# Patient Record
Sex: Female | Born: 1960 | Race: Black or African American | Hispanic: No | Marital: Single | Smoking: Current every day smoker
Health system: Southern US, Community
[De-identification: ages and names within clinical notes are randomized; demographics above are authoritative.]

## PROBLEM LIST (undated history)

## (undated) DIAGNOSIS — M5430 Sciatica, unspecified side: Secondary | ICD-10-CM

## (undated) DIAGNOSIS — N390 Urinary tract infection, site not specified: Secondary | ICD-10-CM

## (undated) DIAGNOSIS — I10 Essential (primary) hypertension: Secondary | ICD-10-CM

## (undated) DIAGNOSIS — N200 Calculus of kidney: Secondary | ICD-10-CM

## (undated) HISTORY — PX: ABDOMINAL HYSTERECTOMY: SHX81

---

## 2000-03-03 ENCOUNTER — Encounter: Admission: RE | Admit: 2000-03-03 | Discharge: 2000-03-03 | Payer: Self-pay | Admitting: Internal Medicine

## 2000-03-03 ENCOUNTER — Encounter: Payer: Self-pay | Admitting: Internal Medicine

## 2000-03-11 ENCOUNTER — Emergency Department (HOSPITAL_COMMUNITY): Admission: EM | Admit: 2000-03-11 | Discharge: 2000-03-11 | Payer: Self-pay | Admitting: Emergency Medicine

## 2001-09-28 ENCOUNTER — Other Ambulatory Visit: Admission: RE | Admit: 2001-09-28 | Discharge: 2001-09-28 | Payer: Self-pay | Admitting: Internal Medicine

## 2003-05-17 ENCOUNTER — Ambulatory Visit (HOSPITAL_COMMUNITY): Admission: RE | Admit: 2003-05-17 | Discharge: 2003-05-17 | Payer: Self-pay | Admitting: Internal Medicine

## 2004-08-09 ENCOUNTER — Emergency Department (HOSPITAL_COMMUNITY): Admission: EM | Admit: 2004-08-09 | Discharge: 2004-08-09 | Payer: Self-pay | Admitting: Family Medicine

## 2006-11-28 ENCOUNTER — Ambulatory Visit (HOSPITAL_COMMUNITY): Admission: RE | Admit: 2006-11-28 | Discharge: 2006-11-28 | Payer: Self-pay | Admitting: Internal Medicine

## 2007-11-29 ENCOUNTER — Ambulatory Visit (HOSPITAL_COMMUNITY): Admission: RE | Admit: 2007-11-29 | Discharge: 2007-11-29 | Payer: Self-pay | Admitting: Internal Medicine

## 2009-03-25 ENCOUNTER — Emergency Department (HOSPITAL_COMMUNITY): Admission: EM | Admit: 2009-03-25 | Discharge: 2009-03-25 | Payer: Self-pay | Admitting: Family Medicine

## 2009-04-01 ENCOUNTER — Emergency Department (HOSPITAL_COMMUNITY): Admission: EM | Admit: 2009-04-01 | Discharge: 2009-04-01 | Payer: Self-pay | Admitting: Emergency Medicine

## 2009-04-02 ENCOUNTER — Inpatient Hospital Stay (HOSPITAL_COMMUNITY): Admission: EM | Admit: 2009-04-02 | Discharge: 2009-04-04 | Payer: Self-pay | Admitting: Emergency Medicine

## 2009-04-04 ENCOUNTER — Encounter: Payer: Self-pay | Admitting: Internal Medicine

## 2009-07-17 ENCOUNTER — Ambulatory Visit (HOSPITAL_COMMUNITY): Admission: RE | Admit: 2009-07-17 | Discharge: 2009-07-17 | Payer: Self-pay | Admitting: Internal Medicine

## 2010-05-10 ENCOUNTER — Encounter: Payer: Self-pay | Admitting: Internal Medicine

## 2010-07-20 LAB — BASIC METABOLIC PANEL
BUN: 16 mg/dL (ref 6–23)
CO2: 28 mEq/L (ref 19–32)
Chloride: 105 mEq/L (ref 96–112)
Potassium: 3 mEq/L — ABNORMAL LOW (ref 3.5–5.1)

## 2010-07-21 LAB — URINALYSIS, ROUTINE W REFLEX MICROSCOPIC
Glucose, UA: NEGATIVE mg/dL
Nitrite: NEGATIVE
Specific Gravity, Urine: 1.027 (ref 1.005–1.030)
pH: 6 (ref 5.0–8.0)

## 2010-07-21 LAB — DIFFERENTIAL
Eosinophils Relative: 1 % (ref 0–5)
Lymphocytes Relative: 20 % (ref 12–46)
Lymphs Abs: 1.1 10*3/uL (ref 0.7–4.0)
Monocytes Absolute: 0.3 10*3/uL (ref 0.1–1.0)
Monocytes Relative: 5 % (ref 3–12)

## 2010-07-21 LAB — BASIC METABOLIC PANEL
Chloride: 106 mEq/L (ref 96–112)
Potassium: 3 mEq/L — ABNORMAL LOW (ref 3.5–5.1)

## 2010-07-21 LAB — SEDIMENTATION RATE: Sed Rate: 14 mm/hr (ref 0–22)

## 2010-07-21 LAB — CBC
HCT: 38 % (ref 36.0–46.0)
Hemoglobin: 12.9 g/dL (ref 12.0–15.0)
RBC: 4.14 MIL/uL (ref 3.87–5.11)
WBC: 5.4 10*3/uL (ref 4.0–10.5)

## 2010-12-13 ENCOUNTER — Emergency Department (HOSPITAL_COMMUNITY)
Admission: EM | Admit: 2010-12-13 | Discharge: 2010-12-13 | Discharge status: Against Medical Advice | Payer: PRIVATE HEALTH INSURANCE | Attending: Emergency Medicine | Admitting: Emergency Medicine

## 2010-12-13 DIAGNOSIS — M545 Low back pain, unspecified: Secondary | ICD-10-CM | POA: Insufficient documentation

## 2010-12-13 DIAGNOSIS — M25559 Pain in unspecified hip: Secondary | ICD-10-CM | POA: Insufficient documentation

## 2010-12-29 ENCOUNTER — Emergency Department (HOSPITAL_COMMUNITY)
Admission: EM | Admit: 2010-12-29 | Discharge: 2010-12-29 | Disposition: A | Discharge status: Home / Self Care | Payer: PRIVATE HEALTH INSURANCE | Attending: Emergency Medicine | Admitting: Emergency Medicine

## 2010-12-29 DIAGNOSIS — M5430 Sciatica, unspecified side: Secondary | ICD-10-CM | POA: Insufficient documentation

## 2010-12-29 DIAGNOSIS — M549 Dorsalgia, unspecified: Secondary | ICD-10-CM | POA: Insufficient documentation

## 2010-12-29 DIAGNOSIS — I10 Essential (primary) hypertension: Secondary | ICD-10-CM | POA: Insufficient documentation

## 2011-03-12 ENCOUNTER — Other Ambulatory Visit (HOSPITAL_COMMUNITY): Payer: Self-pay | Admitting: Internal Medicine

## 2011-03-12 DIAGNOSIS — Z1231 Encounter for screening mammogram for malignant neoplasm of breast: Secondary | ICD-10-CM

## 2011-04-14 ENCOUNTER — Ambulatory Visit (HOSPITAL_COMMUNITY): Payer: PRIVATE HEALTH INSURANCE | Attending: Internal Medicine

## 2011-05-15 ENCOUNTER — Emergency Department (HOSPITAL_COMMUNITY): Payer: PRIVATE HEALTH INSURANCE

## 2011-05-15 ENCOUNTER — Encounter (HOSPITAL_COMMUNITY): Payer: Self-pay | Admitting: Emergency Medicine

## 2011-05-15 ENCOUNTER — Emergency Department (HOSPITAL_COMMUNITY)
Admission: EM | Admit: 2011-05-15 | Discharge: 2011-05-15 | Disposition: A | Discharge status: Home / Self Care | Payer: PRIVATE HEALTH INSURANCE | Attending: Emergency Medicine | Admitting: Emergency Medicine

## 2011-05-15 DIAGNOSIS — M545 Low back pain, unspecified: Secondary | ICD-10-CM

## 2011-05-15 DIAGNOSIS — I10 Essential (primary) hypertension: Secondary | ICD-10-CM | POA: Insufficient documentation

## 2011-05-15 DIAGNOSIS — N200 Calculus of kidney: Secondary | ICD-10-CM | POA: Insufficient documentation

## 2011-05-15 DIAGNOSIS — M5430 Sciatica, unspecified side: Secondary | ICD-10-CM | POA: Insufficient documentation

## 2011-05-15 HISTORY — DX: Calculus of kidney: N20.0

## 2011-05-15 HISTORY — DX: Sciatica, unspecified side: M54.30

## 2011-05-15 HISTORY — DX: Essential (primary) hypertension: I10

## 2011-05-15 LAB — URINALYSIS, ROUTINE W REFLEX MICROSCOPIC
Glucose, UA: NEGATIVE mg/dL
Ketones, ur: NEGATIVE mg/dL
Leukocytes, UA: NEGATIVE
pH: 7 (ref 5.0–8.0)

## 2011-05-15 LAB — BASIC METABOLIC PANEL
BUN: 23 mg/dL (ref 6–23)
CO2: 28 mEq/L (ref 19–32)
Chloride: 103 mEq/L (ref 96–112)
Creatinine, Ser: 0.68 mg/dL (ref 0.50–1.10)

## 2011-05-15 MED ORDER — ONDANSETRON HCL 4 MG/2ML IJ SOLN
4.0000 mg | Freq: Once | INTRAMUSCULAR | Status: AC
  Administered 2011-05-15: 4 mg via INTRAVENOUS
  Filled 2011-05-15: qty 2

## 2011-05-15 MED ORDER — MORPHINE SULFATE 4 MG/ML IJ SOLN
4.0000 mg | Freq: Once | INTRAMUSCULAR | Status: AC
  Administered 2011-05-15: 4 mg via INTRAVENOUS
  Filled 2011-05-15: qty 1

## 2011-05-15 MED ORDER — DIAZEPAM 5 MG PO TABS: 5.0000 mg | ORAL_TABLET | Freq: Two times a day (BID) | ORAL | Status: AC

## 2011-05-15 MED ORDER — KETOROLAC TROMETHAMINE 30 MG/ML IJ SOLN
30.0000 mg | Freq: Once | INTRAMUSCULAR | Status: AC
  Administered 2011-05-15: 30 mg via INTRAVENOUS
  Filled 2011-05-15: qty 1

## 2011-05-15 NOTE — ED Notes (Signed)
Right flank pain started yesterday. Hx kidney stones

## 2011-05-15 NOTE — ED Provider Notes (Signed)
History     CSN: [id number]  Arrival date & time 05/15/11  0902   First MD Initiated Contact with Patient 05/15/11 [phone]      Chief Complaint  Patient presents with  . Flank Pain    (Consider location/radiation/quality/duration/timing/severity/associated sxs/prior treatment) HPI Comments: Patient presents with right flank pain that began yesterday afternoon while patient was at home doing housework.  She did not perform any specific heavy lifting or new exercising.  She had no falls or injuries.  She states that this pain feels similar to when she had a kidney stone approximately 15 years ago.  She denies any dysuria or hematuria.  She's had a hysterectomy and so does not have normal menses at this time.  She does note the pain to intense and is on no other specific inciting or relieving factors for it.  Patient has noted a mild amount of constipation but is also intermittently taking hydrocodone for her sciatica.  She has some associated nausea but no vomiting.  No fevers.  Patient is a 51 y.o. female presenting with flank pain. The history is provided by the patient. No language interpreter was used.  Flank Pain This is a new problem. The current episode started yesterday. The problem occurs constantly. The problem has been gradually worsening. Pertinent negatives include no chest pain, no abdominal pain, no headaches and no shortness of breath. The symptoms are aggravated by nothing. The symptoms are relieved by nothing. She has tried nothing for the symptoms. The treatment provided no relief.    Past Medical History  Diagnosis Date  . Hypertension   . Kidney stones   . Sciatica     Past Surgical History  Procedure Date  . Abdominal hysterectomy     History reviewed. No pertinent family history.  History  Substance Use Topics  . Smoking status: Current Everyday Smoker -- 0.5 packs/day  . Smokeless tobacco: Not on file  . Alcohol Use: Yes     socially    OB History    Grav Para Term Preterm Abortions TAB SAB Ect Mult Living                  Review of Systems  Constitutional: Negative.  Negative for fever and chills.  HENT: Negative.   Eyes: Negative.  Negative for discharge and redness.  Respiratory: Negative.  Negative for cough and shortness of breath.   Cardiovascular: Negative.  Negative for chest pain.  Gastrointestinal: Positive for nausea. Negative for vomiting, abdominal pain and diarrhea.  Genitourinary: Positive for flank pain. Negative for dysuria and vaginal discharge.  Musculoskeletal: Negative.  Negative for back pain.  Skin: Negative.  Negative for color change and rash.  Neurological: Negative.  Negative for syncope and headaches.  Hematological: Negative.  Negative for adenopathy.  Psychiatric/Behavioral: Negative.  Negative for confusion.  All other systems reviewed and are negative.    Allergies  Review of patient's allergies indicates no known allergies.  Home Medications   Current Outpatient Rx  Name Route Sig Dispense Refill  . ESZOPICLONE 2 MG PO TABS Oral Take 2 mg by mouth at bedtime. Patient takes as needed. Take immediately before bedtime    . HYDROCODONE-ACETAMINOPHEN 5-325 MG PO TABS Oral Take 1 tablet by mouth every 6 (six) hours as needed. For pain    . PREGABALIN 50 MG PO CAPS Oral Take 50 mg by mouth 2 (two) times daily. Patient states that she only takes once per day.    . TRIAMTERENE-HCTZ 37.5-25  MG PO CAPS Oral Take 1 capsule by mouth every morning.      BP 146/85  Pulse 73  Temp(Src) 98.4 F (36.9 C) (Oral)  Resp 16  SpO2 99%  Physical Exam  Nursing note and vitals reviewed. Constitutional: She is oriented to person, place, and time. She appears well-developed and well-nourished.  Non-toxic appearance. She does not have a sickly appearance.  HENT:  Head: Normocephalic and atraumatic.  Eyes: Conjunctivae, EOM and lids are normal. Pupils are equal, round, and reactive to light. No scleral icterus.    Neck: Trachea normal and normal range of motion. Neck supple.  Cardiovascular: Normal rate, regular rhythm and normal heart sounds.   Pulmonary/Chest: Effort normal and breath sounds normal.  Abdominal: Soft. Normal appearance. She exhibits no distension. There is no tenderness. There is no rebound, no guarding and no CVA tenderness.  Genitourinary:       No CVA tenderness on examination  Musculoskeletal: Normal range of motion.       mild right paraspinal lumbar tenderness on examination.  Neurological: She is alert and oriented to person, place, and time. She has normal strength.  Skin: Skin is warm, dry and intact. No rash noted.  Psychiatric: She has a normal mood and affect. Her behavior is normal. Judgment and thought content normal.    ED Course  Procedures (including critical care time)  Results for orders placed during the hospital encounter of 05/15/11  BASIC METABOLIC PANEL      Component Value Range   Sodium 140  135 - 145 (mEq/L)   Potassium 3.4 (*) 3.5 - 5.1 (mEq/L)   Chloride 103  96 - 112 (mEq/L)   CO2 28  19 - 32 (mEq/L)   Glucose, Bld 93  70 - 99 (mg/dL)   BUN 23  6 - 23 (mg/dL)   Creatinine, Ser 1.61  0.50 - 1.10 (mg/dL)   Calcium 9.8  8.4 - 09.6 (mg/dL)   GFR calc non Af Amer >90  >90 (mL/min)   GFR calc Af Amer >90  >90 (mL/min)  URINALYSIS, ROUTINE W REFLEX MICROSCOPIC      Component Value Range   Color, Urine YELLOW  YELLOW    APPearance CLEAR  CLEAR    Specific Gravity, Urine 1.023  1.005 - 1.030    pH 7.0  5.0 - 8.0    Glucose, UA NEGATIVE  NEGATIVE (mg/dL)   Hgb urine dipstick NEGATIVE  NEGATIVE    Bilirubin Urine NEGATIVE  NEGATIVE    Ketones, ur NEGATIVE  NEGATIVE (mg/dL)   Protein, ur NEGATIVE  NEGATIVE (mg/dL)   Urobilinogen, UA 0.2  0.0 - 1.0 (mg/dL)   Nitrite NEGATIVE  NEGATIVE    Leukocytes, UA NEGATIVE  NEGATIVE   PREGNANCY, URINE      Component Value Range   Preg Test, Ur NEGATIVE  NEGATIVE    Ct Abdomen Pelvis Wo  Contrast  05/15/2011  *RADIOLOGY REPORT*  Clinical Data: Flank pain.  Rule out renal calculi  CT ABDOMEN AND PELVIS WITHOUT CONTRAST  Technique:  Multidetector CT imaging of the abdomen and pelvis was performed following the standard protocol without intravenous contrast.  Comparison: None  Findings:  The lung bases are clear.  No pericardial or pleural effusion.  The spleen appears normal.  The there is no focal liver abnormalities identified.  The gallbladder appears normal.  Both adrenal glands are normal.  No biliary dilatation.  The pancreas is negative.  There are multiple small stones within the right renal  collecting system.  These measure up to 3 mm.  Negative for hydronephrosis.   The left kidney is negative.  No nephrolithiasis or hydronephrosis.  No hydroureter.  The urinary bladder appears normal.  No enlarged upper abdominal lymph nodes.  The there is no pelvic or inguinal adenopathy.  The stomach and the small bowel loops are unremarkable.  The colon is negative.  Review of the visualized osseous structures is unremarkable.  IMPRESSION:  1.  Nonobstructing right renal calculi measure up to 3 mm. 2.  No evidence for hydronephrosis or ureterolithiasis.  Original Report Authenticated By: Rosealee Albee, M.D.      MDM  Patient presents with right back pain that is concerning for possible kidney stone based on her history.  Patient has not been found to have a kidney stone that appears to be causing her pain at this time.  Patient shows no signs of urinary tract infection or hematuria on her exam.  She has a soft benign abdomen and so does not appear to have an acute intra-abdominal process at this time.  Her CT abdomen pelvis also does not show any other acute intra-abdominal processes.  Patient is feeling slightly better but will receive one more dose of pain medication prior to discharge.  I've counseled her regarding followup if her pain is not improving or if she has other changes in her  symptoms and she will do so with her primary care physician this week.  Her pain could potentially be musculoskeletal given the location and her history of sciatica and so I will add any prescription for Valium as a muscle relaxant for her at home.        Nat Christen, MD 05/15/11 1013

## 2011-05-18 ENCOUNTER — Ambulatory Visit (HOSPITAL_COMMUNITY)
Admission: RE | Admit: 2011-05-18 | Discharge: 2011-05-18 | Disposition: A | Discharge status: Home / Self Care | Payer: PRIVATE HEALTH INSURANCE | Source: Ambulatory Visit | Attending: Internal Medicine | Admitting: Internal Medicine

## 2011-05-18 ENCOUNTER — Encounter (HOSPITAL_COMMUNITY): Payer: Self-pay | Admitting: Emergency Medicine

## 2011-05-18 ENCOUNTER — Emergency Department (HOSPITAL_COMMUNITY)
Admission: EM | Admit: 2011-05-18 | Discharge: 2011-05-18 | Disposition: A | Discharge status: Home / Self Care | Payer: PRIVATE HEALTH INSURANCE | Attending: Emergency Medicine | Admitting: Emergency Medicine

## 2011-05-18 DIAGNOSIS — F17200 Nicotine dependence, unspecified, uncomplicated: Secondary | ICD-10-CM | POA: Insufficient documentation

## 2011-05-18 DIAGNOSIS — I10 Essential (primary) hypertension: Secondary | ICD-10-CM | POA: Insufficient documentation

## 2011-05-18 DIAGNOSIS — M533 Sacrococcygeal disorders, not elsewhere classified: Secondary | ICD-10-CM | POA: Insufficient documentation

## 2011-05-18 DIAGNOSIS — F411 Generalized anxiety disorder: Secondary | ICD-10-CM | POA: Insufficient documentation

## 2011-05-18 DIAGNOSIS — Z1231 Encounter for screening mammogram for malignant neoplasm of breast: Secondary | ICD-10-CM | POA: Insufficient documentation

## 2011-05-18 DIAGNOSIS — Z87442 Personal history of urinary calculi: Secondary | ICD-10-CM | POA: Insufficient documentation

## 2011-05-18 DIAGNOSIS — M549 Dorsalgia, unspecified: Secondary | ICD-10-CM | POA: Insufficient documentation

## 2011-05-18 MED ORDER — HYDROMORPHONE HCL PF 1 MG/ML IJ SOLN
1.0000 mg | Freq: Once | INTRAMUSCULAR | Status: AC
  Administered 2011-05-18: 1 mg via INTRAVENOUS
  Filled 2011-05-18: qty 1

## 2011-05-18 MED ORDER — KETOROLAC TROMETHAMINE 30 MG/ML IJ SOLN
30.0000 mg | Freq: Once | INTRAMUSCULAR | Status: AC
  Administered 2011-05-18: 30 mg via INTRAVENOUS
  Filled 2011-05-18: qty 1

## 2011-05-18 MED ORDER — ONDANSETRON HCL 4 MG/2ML IJ SOLN
4.0000 mg | Freq: Once | INTRAMUSCULAR | Status: AC
  Administered 2011-05-18: 4 mg via INTRAVENOUS
  Filled 2011-05-18: qty 2

## 2011-05-18 MED ORDER — SODIUM CHLORIDE 0.9 % IV SOLN
Freq: Once | INTRAVENOUS | Status: AC
  Administered 2011-05-18: 20 mL via INTRAVENOUS

## 2011-05-18 MED ORDER — HYDROMORPHONE HCL 4 MG PO TABS
4.0000 mg | ORAL_TABLET | ORAL | Status: AC | PRN
Start: 2011-05-18 — End: 2011-05-28

## 2011-05-18 NOTE — ED Notes (Signed)
Pt is c/o low back that initially started on Saturday  Pt states she was seen here then  Pt states the pain came back tonight at 7pm   Pt states the medication is not helping  Pt states she has had vomiting tonight  Pt states the pain is in her lower right side of her back and sometimes radiates along the top of her hip bone  Pt is crying in triage

## 2011-05-18 NOTE — ED Provider Notes (Signed)
History     CSN: [id number]  Arrival date & time 05/18/11  0306   First MD Initiated Contact with Patient 05/18/11 [phone]      Chief Complaint  Patient presents with  . Back Pain    (Consider location/radiation/quality/duration/timing/severity/associated sxs/prior treatment) HPI This is a 51 year old black female with a three-day history of low back pain. The pain is located right sacroiliac joint it radiates around to the right groin. The pain is severe. She was seen in the ED 3 days ago and placed on Valium and oxycodone. She states she's not getting relief from these medications. A CT scan was done at that time which showed no evidence of ureterolithiasis or other acute intra-abdominal pathology. On arrival to the ED she was initially screaming and had been vomiting. She had an apparent syncopal episode in her room but has returned to her baseline. She denies any numbness or weakness in the right lower extremity. She states the pain is not like that of sciatica, for which she had an injection 3 weeks ago on the left side. She has not had a history of right-sided pain in the past.  Past Medical History  Diagnosis Date  . Hypertension   . Kidney stones   . Sciatica     Past Surgical History  Procedure Date  . Abdominal hysterectomy     History reviewed. No pertinent family history.  History  Substance Use Topics  . Smoking status: Current Everyday Smoker -- 0.5 packs/day  . Smokeless tobacco: Not on file  . Alcohol Use: Yes     socially    OB History    Grav Para Term Preterm Abortions TAB SAB Ect Mult Living                  Review of Systems  All other systems reviewed and are negative.    Allergies  Review of patient's allergies indicates no known allergies.  Home Medications   Current Outpatient Rx  Name Route Sig Dispense Refill  . VITAMIN D 1000 UNITS PO TABS Oral Take 1,000 Units by mouth daily.    Marland Kitchen DIAZEPAM 5 MG PO TABS Oral Take 1 tablet (5 mg  total) by mouth 2 (two) times daily. 10 tablet 0  . ESZOPICLONE 2 MG PO TABS Oral Take 2 mg by mouth at bedtime. Patient takes as needed. Take immediately before bedtime    . HYDROCODONE-ACETAMINOPHEN 5-325 MG PO TABS Oral Take 1 tablet by mouth every 6 (six) hours as needed. For pain    . IBUPROFEN 200 MG PO TABS Oral Take 400 mg by mouth every 6 (six) hours as needed. For pain    . MAGNESIUM 250 MG PO TABS Oral Take 1 tablet by mouth daily.    Marland Kitchen POTASSIUM 95 MG PO TABS Oral Take 1 tablet by mouth daily.    Marland Kitchen PREGABALIN 50 MG PO CAPS Oral Take 50 mg by mouth 2 (two) times daily. Patient states that she only takes once per day.    . TRIAMTERENE-HCTZ 37.5-25 MG PO CAPS Oral Take 1 capsule by mouth every morning.      BP 153/108  Pulse 82  Temp(Src) 97.8 F (36.6 C) (Oral)  Resp 24  SpO2 100%  Physical Exam General: Well-developed, well-nourished female in no acute distress; appearance consistent with age of record HENT: normocephalic, atraumatic Eyes: pupils equal round and reactive to light; extraocular muscles intact Neck: supple Heart: regular rate and rhythm Lungs: clear to auscultation bilaterally Abdomen:  soft; nondistended; nontender Back: Right SI tenderness Extremities: No deformity; full range of motion; pain on range of motion of right hip; pulses normal Neurologic: Awake, alert; motor function intact in all extremities and symmetric; no facial droop Skin: Warm and dry Psychiatric: Tearful; anxious    ED Course  Procedures (including critical care time)     MDM  4:29 AM Pain improved with IV Dilaudid. Patient will followup with Dr. Thurston Hole. We'll switch the patient to go on a by mouth.         Hanley Seamen, MD 05/18/11 [phone]

## 2011-05-18 NOTE — ED Notes (Signed)
C/o severe pain along right flank area radiating around to right lower quadrant---Pt. Tearful, unable to find any position of comfort.

## 2011-05-18 NOTE — ED Notes (Signed)
Pt. Was almost hysterical in pain when suddenly, she became very quiet, still and staring straight ahead--Dr. In to assess pt.

## 2012-03-19 ENCOUNTER — Emergency Department (HOSPITAL_COMMUNITY)
Admission: EM | Admit: 2012-03-19 | Discharge: 2012-03-19 | Disposition: A | Discharge status: Home / Self Care | Payer: PRIVATE HEALTH INSURANCE | Attending: Emergency Medicine | Admitting: Emergency Medicine

## 2012-03-19 ENCOUNTER — Encounter (HOSPITAL_COMMUNITY): Payer: Self-pay | Admitting: Emergency Medicine

## 2012-03-19 DIAGNOSIS — R109 Unspecified abdominal pain: Secondary | ICD-10-CM | POA: Insufficient documentation

## 2012-03-19 DIAGNOSIS — Z8739 Personal history of other diseases of the musculoskeletal system and connective tissue: Secondary | ICD-10-CM | POA: Insufficient documentation

## 2012-03-19 DIAGNOSIS — N390 Urinary tract infection, site not specified: Secondary | ICD-10-CM | POA: Insufficient documentation

## 2012-03-19 DIAGNOSIS — Z87442 Personal history of urinary calculi: Secondary | ICD-10-CM | POA: Insufficient documentation

## 2012-03-19 DIAGNOSIS — I10 Essential (primary) hypertension: Secondary | ICD-10-CM | POA: Insufficient documentation

## 2012-03-19 DIAGNOSIS — F17200 Nicotine dependence, unspecified, uncomplicated: Secondary | ICD-10-CM | POA: Insufficient documentation

## 2012-03-19 LAB — URINE MICROSCOPIC-ADD ON

## 2012-03-19 LAB — URINALYSIS, ROUTINE W REFLEX MICROSCOPIC
Specific Gravity, Urine: 1.016 (ref 1.005–1.030)
Urobilinogen, UA: 1 mg/dL (ref 0.0–1.0)

## 2012-03-19 MED ORDER — NITROFURANTOIN MONOHYD MACRO 100 MG PO CAPS: 100.0000 mg | ORAL_CAPSULE | Freq: Two times a day (BID) | ORAL | Status: DC

## 2012-03-19 NOTE — ED Notes (Signed)
Pt alert and oriented x4. Respirations even and unlabored, bilateral symmetrical rise and fall of chest. Skin warm and dry. In no acute distress. Denies needs.   

## 2012-03-19 NOTE — ED Notes (Signed)
Pt escorted to discharge window. Pt verbalized understanding discharge instructions. In no acute distress.  

## 2012-03-19 NOTE — ED Provider Notes (Signed)
History     CSN: [id number]  Arrival date & time 03/19/12  1020   First MD Initiated Contact with Patient 03/19/12 1206      Chief Complaint  Patient presents with  . Hematuria    (Consider location/radiation/quality/duration/timing/severity/associated sxs/prior treatment) HPI Comments: Patient presents with complaint of hematuria that began yesterday and became worse this morning. She also endorses mild suprapubic discomfort and denies any pain. She's not had any pain in her flank. No vaginal bleeding or discharge. No fever, nausea or vomiting. Patient thinks that she has a urinary tract infection. She does not have a history of these. Onset acute. Course is constant. Nothing makes symptoms better or worse.  Patient is a 51 y.o. female presenting with hematuria. The history is provided by the patient.  Hematuria Pertinent negatives include no dysuria, fever, flank pain, nausea or vomiting.    Past Medical History  Diagnosis Date  . Hypertension   . Kidney stones   . Sciatica     Past Surgical History  Procedure Date  . Abdominal hysterectomy     History reviewed. No pertinent family history.  History  Substance Use Topics  . Smoking status: Current Every Day Smoker -- 0.5 packs/day  . Smokeless tobacco: Not on file  . Alcohol Use: Yes     Comment: socially    OB History    Grav Para Term Preterm Abortions TAB SAB Ect Mult Living                  Review of Systems  Constitutional: Negative for fever.  HENT: Negative for sore throat and rhinorrhea.   Eyes: Negative for redness.  Respiratory: Negative for cough.   Cardiovascular: Negative for chest pain.  Gastrointestinal: Negative for nausea and vomiting.  Genitourinary: Positive for hematuria. Negative for dysuria, flank pain, vaginal bleeding and vaginal discharge.  Musculoskeletal: Negative for myalgias.  Skin: Negative for rash.  Neurological: Negative for headaches.    Allergies  Review of patient's  allergies indicates no known allergies.  Home Medications   Current Outpatient Rx  Name  Route  Sig  Dispense  Refill  . VITAMIN D 1000 UNITS PO TABS   Oral   Take 1,000 Units by mouth daily.         Marland Kitchen ESZOPICLONE 2 MG PO TABS   Oral   Take 2 mg by mouth at bedtime. Patient takes as needed. Take immediately before bedtime         . IBUPROFEN 200 MG PO TABS   Oral   Take 400 mg by mouth every 6 (six) hours as needed. For pain         . MAGNESIUM 250 MG PO TABS   Oral   Take 1 tablet by mouth daily.         Marland Kitchen NITROFURANTOIN MONOHYD MACRO 100 MG PO CAPS   Oral   Take 1 capsule (100 mg total) by mouth 2 (two) times daily.   10 capsule   0   . POTASSIUM 95 MG PO TABS   Oral   Take 1 tablet by mouth daily.         Marland Kitchen PREGABALIN 50 MG PO CAPS   Oral   Take 50 mg by mouth 2 (two) times daily. Patient states that she only takes once per day.         . TRIAMTERENE-HCTZ 37.5-25 MG PO CAPS   Oral   Take 1 capsule by mouth every morning.  BP 125/77  Pulse 76  Temp 98.1 F (36.7 C) (Oral)  Resp 16  SpO2 98%  Physical Exam  Nursing note and vitals reviewed. Constitutional: She appears well-developed and well-nourished.  HENT:  Head: Normocephalic and atraumatic.  Eyes: Conjunctivae normal are normal.  Neck: Normal range of motion. Neck supple.  Cardiovascular: Normal rate and regular rhythm.   Pulmonary/Chest: Breath sounds normal. No respiratory distress.  Abdominal: Soft. Bowel sounds are normal. She exhibits no distension. There is no tenderness. There is no rebound and no guarding.  Neurological: She is alert.  Skin: Skin is warm and dry.  Psychiatric: She has a normal mood and affect.    ED Course  Procedures (including critical care time)  Labs Reviewed  URINALYSIS, ROUTINE W REFLEX MICROSCOPIC - Abnormal; Notable for the following:    Color, Urine RED (*)  BIOCHEMICALS MAY BE AFFECTED BY COLOR   APPearance TURBID (*)     Hgb urine  dipstick LARGE (*)     Bilirubin Urine SMALL (*)     Ketones, ur TRACE (*)     Protein, ur 100 (*)     Nitrite POSITIVE (*)     Leukocytes, UA MODERATE (*)     All other components within normal limits  URINE MICROSCOPIC-ADD ON - Abnormal; Notable for the following:    Bacteria, UA MANY (*)     All other components within normal limits  URINE CULTURE   No results found.   1. UTI (lower urinary tract infection)    12:17 PM Patient seen and examined. Work-up initiated. Will treat for UTi. Culture sent. Patient can see PCP next week for recheck.   Vital signs reviewed and are as follows: Filed Vitals:   03/19/12 1216  BP: 125/77  Pulse: 76  Temp: 98.1 F (36.7 C)  Resp: 16    Patient urged to return with worsening symptoms or other concerns. Patient verbalized understanding and agrees with plan.   MDM  Hematuria, nitrite +, mild suprapubic discomfort -- no evidence of pyleo or kidney stone. Will treat as UTI and have PCP follow-up. Patient appears well and agrees.        Renne Crigler, [place] 03/19/12 1220

## 2012-03-19 NOTE — ED Notes (Signed)
Pt states that she noticed that her urine was rust colored last night but when she woke up this morning, it was red.  Denies pain, only states that there is a little pressure.

## 2012-03-20 NOTE — ED Provider Notes (Signed)
History/physical exam/procedure(s) were performed by non-physician practitioner and as supervising physician I was immediately available for consultation/collaboration. I have reviewed all notes and am in agreement with care and plan.   Hilario Quarry, MD 03/20/12 [phone]

## 2012-03-21 LAB — URINE CULTURE

## 2012-03-22 NOTE — ED Notes (Signed)
+   urine Patient treated with macrobid-sensitive to same-chart appended per protocol MD.

## 2012-04-19 DIAGNOSIS — N390 Urinary tract infection, site not specified: Secondary | ICD-10-CM

## 2012-04-19 HISTORY — DX: Urinary tract infection, site not specified: N39.0

## 2012-06-02 ENCOUNTER — Other Ambulatory Visit (HOSPITAL_COMMUNITY): Payer: Self-pay | Admitting: Advanced Practice Midwife

## 2012-06-02 DIAGNOSIS — Z1231 Encounter for screening mammogram for malignant neoplasm of breast: Secondary | ICD-10-CM

## 2012-06-13 ENCOUNTER — Ambulatory Visit (HOSPITAL_COMMUNITY)
Admission: RE | Admit: 2012-06-13 | Discharge: 2012-06-13 | Disposition: A | Discharge status: Home / Self Care | Payer: PRIVATE HEALTH INSURANCE | Source: Ambulatory Visit | Attending: Advanced Practice Midwife | Admitting: Advanced Practice Midwife

## 2012-06-13 DIAGNOSIS — Z1231 Encounter for screening mammogram for malignant neoplasm of breast: Secondary | ICD-10-CM

## 2012-09-17 ENCOUNTER — Emergency Department (HOSPITAL_COMMUNITY)
Admission: EM | Admit: 2012-09-17 | Discharge: 2012-09-17 | Disposition: A | Discharge status: Home / Self Care | Payer: PRIVATE HEALTH INSURANCE | Attending: Emergency Medicine | Admitting: Emergency Medicine

## 2012-09-17 ENCOUNTER — Emergency Department (HOSPITAL_COMMUNITY): Payer: PRIVATE HEALTH INSURANCE

## 2012-09-17 ENCOUNTER — Encounter (HOSPITAL_COMMUNITY): Payer: Self-pay | Admitting: *Deleted

## 2012-09-17 DIAGNOSIS — F17200 Nicotine dependence, unspecified, uncomplicated: Secondary | ICD-10-CM | POA: Insufficient documentation

## 2012-09-17 DIAGNOSIS — R300 Dysuria: Secondary | ICD-10-CM | POA: Insufficient documentation

## 2012-09-17 DIAGNOSIS — R112 Nausea with vomiting, unspecified: Secondary | ICD-10-CM | POA: Insufficient documentation

## 2012-09-17 DIAGNOSIS — R509 Fever, unspecified: Secondary | ICD-10-CM | POA: Insufficient documentation

## 2012-09-17 DIAGNOSIS — R319 Hematuria, unspecified: Secondary | ICD-10-CM | POA: Insufficient documentation

## 2012-09-17 DIAGNOSIS — I10 Essential (primary) hypertension: Secondary | ICD-10-CM | POA: Insufficient documentation

## 2012-09-17 DIAGNOSIS — Z87442 Personal history of urinary calculi: Secondary | ICD-10-CM | POA: Insufficient documentation

## 2012-09-17 DIAGNOSIS — N12 Tubulo-interstitial nephritis, not specified as acute or chronic: Secondary | ICD-10-CM

## 2012-09-17 DIAGNOSIS — R000 Tachycardia, unspecified: Secondary | ICD-10-CM | POA: Insufficient documentation

## 2012-09-17 DIAGNOSIS — Z87440 Personal history of urinary (tract) infections: Secondary | ICD-10-CM | POA: Insufficient documentation

## 2012-09-17 DIAGNOSIS — M5430 Sciatica, unspecified side: Secondary | ICD-10-CM | POA: Insufficient documentation

## 2012-09-17 DIAGNOSIS — Z79899 Other long term (current) drug therapy: Secondary | ICD-10-CM | POA: Insufficient documentation

## 2012-09-17 HISTORY — DX: Urinary tract infection, site not specified: N39.0

## 2012-09-17 LAB — CBC WITH DIFFERENTIAL/PLATELET
Eosinophils Relative: 0 % (ref 0–5)
HCT: 39.5 % (ref 36.0–46.0)
Hemoglobin: 13.9 g/dL (ref 12.0–15.0)
Lymphocytes Relative: 13 % (ref 12–46)
Lymphs Abs: 1 10*3/uL (ref 0.7–4.0)
MCV: 87.8 fL (ref 78.0–100.0)
Monocytes Absolute: 1 10*3/uL (ref 0.1–1.0)
RBC: 4.5 MIL/uL (ref 3.87–5.11)
WBC: 8.1 10*3/uL (ref 4.0–10.5)

## 2012-09-17 LAB — BASIC METABOLIC PANEL
CO2: 27 mEq/L (ref 19–32)
Calcium: 9.1 mg/dL (ref 8.4–10.5)
Creatinine, Ser: 0.64 mg/dL (ref 0.50–1.10)
Glucose, Bld: 109 mg/dL — ABNORMAL HIGH (ref 70–99)
Sodium: 137 mEq/L (ref 135–145)

## 2012-09-17 LAB — URINALYSIS, ROUTINE W REFLEX MICROSCOPIC
Glucose, UA: NEGATIVE mg/dL
Specific Gravity, Urine: 1.02 (ref 1.005–1.030)

## 2012-09-17 LAB — URINE MICROSCOPIC-ADD ON

## 2012-09-17 MED ORDER — CEPHALEXIN 500 MG PO CAPS: 500.0000 mg | ORAL_CAPSULE | Freq: Four times a day (QID) | ORAL | Status: DC

## 2012-09-17 MED ORDER — POTASSIUM CHLORIDE CRYS ER 20 MEQ PO TBCR
60.0000 meq | EXTENDED_RELEASE_TABLET | Freq: Once | ORAL | Status: AC
  Administered 2012-09-17: 60 meq via ORAL
  Filled 2012-09-17: qty 3

## 2012-09-17 MED ORDER — DEXTROSE 5 % IV SOLN
1.0000 g | INTRAVENOUS | Status: DC
  Administered 2012-09-17: 1 g via INTRAVENOUS
  Filled 2012-09-17: qty 10

## 2012-09-17 MED ORDER — MORPHINE SULFATE 4 MG/ML IJ SOLN
4.0000 mg | Freq: Once | INTRAMUSCULAR | Status: AC
  Administered 2012-09-17: 4 mg via INTRAVENOUS
  Filled 2012-09-17: qty 1

## 2012-09-17 MED ORDER — ONDANSETRON HCL 4 MG/2ML IJ SOLN
4.0000 mg | Freq: Once | INTRAMUSCULAR | Status: AC
  Administered 2012-09-17: 4 mg via INTRAVENOUS
  Filled 2012-09-17: qty 2

## 2012-09-17 MED ORDER — ONDANSETRON 8 MG PO TBDP: 8.0000 mg | ORAL_TABLET | Freq: Three times a day (TID) | ORAL | Status: DC | PRN

## 2012-09-17 MED ORDER — OXYCODONE-ACETAMINOPHEN 5-325 MG PO TABS: 2.0000 | ORAL_TABLET | ORAL | Status: DC | PRN

## 2012-09-17 MED ORDER — POTASSIUM CHLORIDE CRYS ER 20 MEQ PO TBCR
40.0000 meq | EXTENDED_RELEASE_TABLET | Freq: Once | ORAL | Status: DC
  Filled 2012-09-17: qty 2

## 2012-09-17 NOTE — ED Notes (Signed)
EDP Allen informed that pt's K+ is 2.6.  Primary nurse, Markle notified as well.

## 2012-09-17 NOTE — ED Notes (Signed)
Pt reports left sided flank pain radiating to back that began after taking bactrim on Friday. Denies pain at this time, but states pain increases with cough and laying on side. Pt also reports pressure type pain on left side of bladder

## 2012-09-17 NOTE — ED Notes (Addendum)
Pt states she saw her PCP on Friday and was dx with a UTI.  Pt was prompted to go to PCP because she had blood in her urine.  Pt has hx of kidney stone 20 years ago, but states her current situation does not feel the same.  A urine culture was also obtained on Friday.  Pt was prescribed Bactrim DS 800-160 and she took it one time Friday.  When pt woke up, she was in pain.  Pt has not taken the Bactrim or any abx since she took that one dose of Bactrim on Friday.  Pt states she has not eaten since Friday and has been in pain since that time.  She attributes these symptoms to the Bactrim.  Pt endorses N/V and reports subjective fever.

## 2012-09-17 NOTE — ED Notes (Signed)
Ice pack given to pt for sore neck

## 2012-09-17 NOTE — ED Provider Notes (Signed)
History     CSN: [id number]  Arrival date & time 09/17/12  4098   First MD Initiated Contact with Patient 09/17/12 [phone]      Chief Complaint  Patient presents with  . Flank Pain    left  . Abdominal Pain    (Consider location/radiation/quality/duration/timing/severity/associated sxs/prior treatment) Patient is a 52 y.o. female presenting with flank pain and abdominal pain. The history is provided by the patient.  Flank Pain Associated symptoms include abdominal pain.  Abdominal Pain Associated symptoms include abdominal pain.   patient here complaining of lower back pain with some associated dysuria and hematuria. Saw her Dr. recently for same and diagnosed with UTI and placed on Bactrim. She endorses fever with nausea and vomiting. No vaginal bleeding or discharge. No rashes noted. Symptoms have been gradually getting worse. Nothing makes her symptoms better  Past Medical History  Diagnosis Date  . Hypertension   . Kidney stones   . Sciatica   . Urinary tract infection 2014    Past Surgical History  Procedure Laterality Date  . Abdominal hysterectomy      No family history on file.  History  Substance Use Topics  . Smoking status: Current Every Day Smoker -- 0.50 packs/day    Types: Cigarettes  . Smokeless tobacco: Never Used  . Alcohol Use: Yes     Comment: socially    OB History   Grav Para Term Preterm Abortions TAB SAB Ect Mult Living                  Review of Systems  Gastrointestinal: Positive for abdominal pain.  Genitourinary: Positive for flank pain.  All other systems reviewed and are negative.    Allergies  Oxycodone  Home Medications   Current Outpatient Rx  Name  Route  Sig  Dispense  Refill  . celecoxib (CELEBREX) 200 MG capsule   Oral   Take 200 mg by mouth daily as needed for pain (pain).         . cholecalciferol (VITAMIN D) 1000 UNITS tablet   Oral   Take 1,000 Units by mouth daily.         . eszopiclone (LUNESTA) 2 MG  TABS   Oral   Take 2 mg by mouth at bedtime. Patient takes as needed. Take immediately before bedtime         . ibuprofen (ADVIL,MOTRIN) 200 MG tablet   Oral   Take 400 mg by mouth every 6 (six) hours as needed. For pain         . Magnesium 250 MG TABS   Oral   Take 1 tablet by mouth daily.         . pregabalin (LYRICA) 50 MG capsule   Oral   Take 50 mg by mouth 2 (two) times daily. Patient states that she only takes once per day.         . sulfamethoxazole-trimethoprim (BACTRIM DS) 800-160 MG per tablet   Oral   Take 1 tablet by mouth 2 (two) times daily. Pt took 1 tablet and has not taken any since Friday. Therapy's for 7 days         . triamterene-hydrochlorothiazide (DYAZIDE) 37.5-25 MG per capsule   Oral   Take 1 capsule by mouth every morning.           BP 130/49  Pulse 102  Temp(Src) 99.9 F (37.7 C) (Oral)  Resp 22  SpO2 99%  Physical Exam  Nursing note and  vitals reviewed. Constitutional: She is oriented to person, place, and time. She appears well-developed and well-nourished.  Non-toxic appearance. No distress.  HENT:  Head: Normocephalic and atraumatic.  Eyes: Conjunctivae, EOM and lids are normal. Pupils are equal, round, and reactive to light.  Neck: Normal range of motion. Neck supple. No tracheal deviation present. No mass present.  Cardiovascular: Regular rhythm and normal heart sounds.  Tachycardia present.  Exam reveals no gallop.   No murmur heard. Pulmonary/Chest: Effort normal and breath sounds normal. No stridor. No respiratory distress. She has no decreased breath sounds. She has no wheezes. She has no rhonchi. She has no rales.  Abdominal: Soft. Normal appearance and bowel sounds are normal. She exhibits no distension. There is no tenderness. There is no rebound and no CVA tenderness.  Musculoskeletal: Normal range of motion. She exhibits no edema and no tenderness.  Neurological: She is alert and oriented to person, place, and time.  She has normal strength. No cranial nerve deficit or sensory deficit. GCS eye subscore is 4. GCS verbal subscore is 5. GCS motor subscore is 6.  Skin: Skin is warm and dry. No abrasion and no rash noted.  Psychiatric: She has a normal mood and affect. Her speech is normal and behavior is normal.    ED Course  Procedures (including critical care time)  Labs Reviewed  CBC WITH DIFFERENTIAL - Abnormal; Notable for the following:    Monocytes Relative 13 (*)    All other components within normal limits  URINALYSIS, ROUTINE W REFLEX MICROSCOPIC  BASIC METABOLIC PANEL   No results found.   No diagnosis found.    MDM  Patient treated for urinary tract infection here with IV Rocephin. Patient's hyperkalemia noted and was given 60 mEq of potassium by mouth. No emesis here. Pain treated with morphine x2. Patient be discharged to home on Keflex and a prescription for nausea medication. She has a prescription for potassium medications and was told to followup with her Dr. for a repeat potassium level        Toy Baker, MD 09/17/12 1228

## 2012-09-19 LAB — URINE CULTURE

## 2012-09-21 ENCOUNTER — Telehealth (HOSPITAL_COMMUNITY): Payer: Self-pay | Admitting: Emergency Medicine

## 2012-09-21 NOTE — ED Notes (Signed)
Post ED Visit - Positive Culture Follow-up  Culture report reviewed by antimicrobial stewardship pharmacist: []  Wes Dulaney, Pharm.D., BCPS [x]  Celedonio Miyamoto, Pharm.D., BCPS []  Georgina Pillion, Pharm.D., BCPS []  [place], [address].D., BCPS, AAHIVP []  Estella Husk, Pharm.D., BCPS, AAHIVP  Positive urine culture Treated with Keflex, organism sensitive to the same and no further patient follow-up is required at this time.  Kylie A Holland 09/21/2012, 12:22 PM

## 2013-06-06 ENCOUNTER — Other Ambulatory Visit (HOSPITAL_COMMUNITY): Payer: Self-pay | Admitting: Internal Medicine

## 2013-06-06 DIAGNOSIS — Z1231 Encounter for screening mammogram for malignant neoplasm of breast: Secondary | ICD-10-CM

## 2013-06-18 ENCOUNTER — Other Ambulatory Visit (HOSPITAL_COMMUNITY): Payer: Self-pay | Admitting: Internal Medicine

## 2013-06-18 ENCOUNTER — Ambulatory Visit (HOSPITAL_COMMUNITY)
Admission: RE | Admit: 2013-06-18 | Discharge: 2013-06-18 | Disposition: A | Discharge status: Home / Self Care | Payer: PRIVATE HEALTH INSURANCE | Source: Ambulatory Visit | Attending: Internal Medicine | Admitting: Internal Medicine

## 2013-06-18 DIAGNOSIS — Z1231 Encounter for screening mammogram for malignant neoplasm of breast: Secondary | ICD-10-CM

## 2014-09-09 ENCOUNTER — Other Ambulatory Visit (HOSPITAL_COMMUNITY): Payer: Self-pay | Admitting: Internal Medicine

## 2014-09-09 DIAGNOSIS — Z1231 Encounter for screening mammogram for malignant neoplasm of breast: Secondary | ICD-10-CM

## 2014-09-12 ENCOUNTER — Ambulatory Visit (HOSPITAL_COMMUNITY)
Admission: RE | Admit: 2014-09-12 | Discharge: 2014-09-12 | Disposition: A | Discharge status: Home / Self Care | Payer: PRIVATE HEALTH INSURANCE | Source: Ambulatory Visit | Attending: Internal Medicine | Admitting: Internal Medicine

## 2014-09-12 DIAGNOSIS — Z1231 Encounter for screening mammogram for malignant neoplasm of breast: Secondary | ICD-10-CM

## 2015-09-16 ENCOUNTER — Other Ambulatory Visit: Payer: Self-pay

## 2015-09-16 DIAGNOSIS — Z1231 Encounter for screening mammogram for malignant neoplasm of breast: Secondary | ICD-10-CM

## 2015-09-25 ENCOUNTER — Ambulatory Visit
Admission: RE | Admit: 2015-09-25 | Discharge: 2015-09-25 | Disposition: A | Discharge status: Home / Self Care | Payer: PRIVATE HEALTH INSURANCE | Source: Ambulatory Visit

## 2015-09-25 DIAGNOSIS — Z1231 Encounter for screening mammogram for malignant neoplasm of breast: Secondary | ICD-10-CM

## 2017-10-28 ENCOUNTER — Other Ambulatory Visit: Payer: Self-pay | Admitting: Internal Medicine

## 2017-10-28 DIAGNOSIS — N6310 Unspecified lump in the right breast, unspecified quadrant: Secondary | ICD-10-CM

## 2017-11-02 ENCOUNTER — Ambulatory Visit
Admission: RE | Admit: 2017-11-02 | Discharge: 2017-11-02 | Disposition: A | Discharge status: Home / Self Care | Payer: PRIVATE HEALTH INSURANCE | Source: Ambulatory Visit | Attending: Internal Medicine | Admitting: Internal Medicine

## 2017-11-02 DIAGNOSIS — N6310 Unspecified lump in the right breast, unspecified quadrant: Secondary | ICD-10-CM

## 2017-12-01 ENCOUNTER — Other Ambulatory Visit: Payer: Self-pay | Admitting: Internal Medicine

## 2017-12-01 DIAGNOSIS — Z1231 Encounter for screening mammogram for malignant neoplasm of breast: Secondary | ICD-10-CM

## 2018-11-06 ENCOUNTER — Other Ambulatory Visit: Payer: Self-pay

## 2018-11-06 ENCOUNTER — Ambulatory Visit
Admission: RE | Admit: 2018-11-06 | Discharge: 2018-11-06 | Disposition: A | Discharge status: Home / Self Care | Payer: PRIVATE HEALTH INSURANCE | Source: Ambulatory Visit | Attending: Internal Medicine | Admitting: Internal Medicine

## 2018-11-06 DIAGNOSIS — Z1231 Encounter for screening mammogram for malignant neoplasm of breast: Secondary | ICD-10-CM

## 2019-05-29 ENCOUNTER — Encounter: Payer: Self-pay | Admitting: Family Medicine

## 2019-05-29 ENCOUNTER — Ambulatory Visit: Payer: PRIVATE HEALTH INSURANCE | Admitting: Family Medicine

## 2019-05-29 VITALS — BP 132/88 | HR 76 | Ht 65.0 in | Wt 197.0 lb

## 2019-05-29 DIAGNOSIS — Z716 Tobacco abuse counseling: Secondary | ICD-10-CM

## 2019-05-29 DIAGNOSIS — Z789 Other specified health status: Secondary | ICD-10-CM

## 2019-05-29 MED ORDER — NICOTINE 14 MG/24HR TD PT24: 14.0000 mg | MEDICATED_PATCH | Freq: Every day | TRANSDERMAL | 1 refills | Status: DC

## 2019-05-29 NOTE — Patient Instructions (Signed)
Steps to Quit Smoking Smoking tobacco is the leading cause of preventable death. It can affect almost every organ in the body. Smoking puts you and people around you at risk for many serious, long-lasting (chronic) diseases. Quitting smoking can be hard, but it is one of the best things that you can do for your health. It is never too late to quit. How do I get ready to quit? When you decide to quit smoking, make a plan to help you succeed. Before you quit:  Pick a date to quit. Set a date within the next 2 weeks to give you time to prepare.  Write down the reasons why you are quitting. Keep this list in places where you will see it often.  Tell your family, friends, and co-workers that you are quitting. Their support is important.  Talk with your doctor about the choices that may help you quit.  Find out if your health insurance will pay for these treatments.  Know the people, places, things, and activities that make you want to smoke (triggers). Avoid them. What first steps can I take to quit smoking?  Throw away all cigarettes at home, at work, and in your car.  Throw away the things that you use when you smoke, such as ashtrays and lighters.  Clean your car. Make sure to empty the ashtray.  Clean your home, including curtains and carpets. What can I do to help me quit smoking? Talk with your doctor about taking medicines and seeing a counselor at the same time. You are more likely to succeed when you do both.  If you are pregnant or breastfeeding, talk with your doctor about counseling or other ways to quit smoking. Do not take medicine to help you quit smoking unless your doctor tells you to do so. To quit smoking: Quit right away  Quit smoking totally, instead of slowly cutting back on how much you smoke over a period of time.  Go to counseling. You are more likely to quit if you go to counseling sessions regularly. Take medicine You may take medicines to help you quit. Some  medicines need a prescription, and some you can buy over-the-counter. Some medicines may contain a drug called nicotine to replace the nicotine in cigarettes. Medicines may:  Help you to stop having the desire to smoke (cravings).  Help to stop the problems that come when you stop smoking (withdrawal symptoms). Your doctor may ask you to use:  Nicotine patches, gum, or lozenges.  Nicotine inhalers or sprays.  Non-nicotine medicine that is taken by mouth. Find resources Find resources and other ways to help you quit smoking and remain smoke-free after you quit. These resources are most helpful when you use them often. They include:  Online chats with a counselor.  Phone quitlines.  Printed self-help materials.  Support groups or group counseling.  Text messaging programs.  Mobile phone apps. Use apps on your mobile phone or tablet that can help you stick to your quit plan. There are many free apps for mobile phones and tablets as well as websites. Examples include Quit Guide from the CDC and smokefree.gov  What things can I do to make it easier to quit?   Talk to your family and friends. Ask them to support and encourage you.  Call a phone quitline (1-800-QUIT-NOW), reach out to support groups, or work with a counselor.  Ask people who smoke to not smoke around you.  Avoid places that make you want to smoke,   such as: ? Bars. ? Parties. ? Smoke-break areas at work.  Spend time with people who do not smoke.  Lower the stress in your life. Stress can make you want to smoke. Try these things to help your stress: ? Getting regular exercise. ? Doing deep-breathing exercises. ? Doing yoga. ? Meditating. ? Doing a body scan. To do this, close your eyes, focus on one area of your body at a time from head to toe. Notice which parts of your body are tense. Try to relax the muscles in those areas. How will I feel when I quit smoking? Day 1 to 3 weeks Within the first 24 hours,  you may start to have some problems that come from quitting tobacco. These problems are very bad 2-3 days after you quit, but they do not often last for more than 2-3 weeks. You may get these symptoms:  Mood swings.  Feeling restless, nervous, angry, or annoyed.  Trouble concentrating.  Dizziness.  Strong desire for high-sugar foods and nicotine.  Weight gain.  Trouble pooping (constipation).  Feeling like you may vomit (nausea).  Coughing or a sore throat.  Changes in how the medicines that you take for other issues work in your body.  Depression.  Trouble sleeping (insomnia). Week 3 and afterward After the first 2-3 weeks of quitting, you may start to notice more positive results, such as:  Better sense of smell and taste.  Less coughing and sore throat.  Slower heart rate.  Lower blood pressure.  Clearer skin.  Better breathing.  Fewer sick days. Quitting smoking can be hard. Do not give up if you fail the first time. Some people need to try a few times before they succeed. Do your best to stick to your quit plan, and talk with your doctor if you have any questions or concerns. Summary  Smoking tobacco is the leading cause of preventable death. Quitting smoking can be hard, but it is one of the best things that you can do for your health.  When you decide to quit smoking, make a plan to help you succeed.  Quit smoking right away, not slowly over a period of time.  When you start quitting, seek help from your doctor, family, or friends. This information is not intended to replace advice given to you by your health care provider. Make sure you discuss any questions you have with your health care provider. Document Revised: 12/29/2018 Document Reviewed: 06/24/2018 Elsevier Patient Education  2020 [employer]  

## 2019-05-29 NOTE — Progress Notes (Signed)
Subjective:     Patient ID: Zoe Monroe, female   DOB: 1961-04-14, 59 y.o.   MRN: [id number]  HPI Zoe Monroe [facility] today for her required wellness visit for her insurance. Her PCP is Dr. Delfina Redwood, last seen in September 2020 per pt. She reports her mammogram and colonoscopy are UTD. She has had a hysterectomy. She states she needs to work on eating healthier and exercising, but states this is difficult due to her working hours.   She is a current smoker, smoking about 1 pack every 3 days. She states she never finishes the full cigarette. She has never tried nicotine patches and would like to as she is ready to quit.   Past Medical History:  Diagnosis Date  . Hypertension   . Kidney stones   . Sciatica   . Urinary tract infection 2014   Allergies  Allergen Reactions  . Oxycodone     Causes her to have a headache.    Current Outpatient Medications:  .  eszopiclone (LUNESTA) 2 MG TABS, Take 2 mg by mouth at bedtime. Patient takes as needed. Take immediately before bedtime, Disp: , Rfl:  .  pregabalin (LYRICA) 50 MG capsule, Take 50 mg by mouth 2 (two) times daily. Patient states that she only takes once per day., Disp: , Rfl:  .  triamterene-hydrochlorothiazide (DYAZIDE) 37.5-25 MG per capsule, Take 1 capsule by mouth every morning., Disp: , Rfl:  .  cholecalciferol (VITAMIN D) 1000 UNITS tablet, Take 1,000 Units by mouth daily., Disp: , Rfl:  .  ibuprofen (ADVIL,MOTRIN) 200 MG tablet, Take 400 mg by mouth every 6 (six) hours as needed. For pain, Disp: , Rfl:  .  Magnesium 250 MG TABS, Take 1 tablet by mouth daily., Disp: , Rfl:  .  nicotine (NICODERM CQ - DOSED IN MG/24 HOURS) 14 mg/24hr patch, Place 1 patch (14 mg total) onto the skin daily., Disp: 28 patch, Rfl: 1  Review of Systems  Constitutional: Negative for chills, fatigue, fever and unexpected weight change.  HENT: Negative for congestion, ear pain, sinus pressure, sinus pain and sore throat.    Eyes: Negative for discharge and visual disturbance.  Respiratory: Negative for cough, shortness of breath and wheezing.   Cardiovascular: Negative for chest pain and leg swelling.  Gastrointestinal: Negative for abdominal pain, blood in stool, constipation, diarrhea, nausea and vomiting.  Genitourinary: Negative for difficulty urinating and hematuria.  Skin: Negative for color change.  Neurological: Negative for dizziness, weakness, light-headedness and headaches.  Hematological: Negative for adenopathy.  All other systems reviewed and are negative.      Objective:   Physical Exam Vitals and nursing note reviewed.  Constitutional:      General: She is not in acute distress.    Appearance: She is well-developed.  HENT:     Head: Normocephalic and atraumatic.  Cardiovascular:     Rate and Rhythm: Normal rate and regular rhythm.     Heart sounds: Normal heart sounds. No murmur.  Pulmonary:     Effort: Pulmonary effort is normal. No respiratory distress.     Breath sounds: Normal breath sounds.  Musculoskeletal:     Cervical back: Neck supple.  Skin:    General: Skin is warm and dry.  Neurological:     Mental Status: She is alert and oriented to person, place, and time.  Psychiatric:        Mood and Affect: Mood normal.        Behavior: Behavior  normal.    Today's Vitals   05/29/19 1510  BP: 132/88  Pulse: 76  SpO2: 96%  Weight: 197 lb (89.4 kg)  Height: 5\' 5"  (1.651 m)   Body mass index is 32.78 kg/m.       Assessment:     Participant in health and wellness plan  Encounter for smoking cessation counseling      Plan:     1. Keep all regular appts with PCP 2. Encouraged healthy diet and exercise for weight loss 3. Counseled on smoking cessation. She would like to try nicotine patch. Rx given. Recommend duration of 6-8 weeks, if doing well at that time can titrate dose to 7 mg patch and then titrate off if desired.

## 2019-07-05 ENCOUNTER — Other Ambulatory Visit: Payer: Self-pay | Admitting: Family Medicine

## 2019-07-05 ENCOUNTER — Ambulatory Visit: Payer: PRIVATE HEALTH INSURANCE | Admitting: Family Medicine

## 2019-07-05 ENCOUNTER — Encounter: Payer: Self-pay | Admitting: Family Medicine

## 2019-07-05 VITALS — BP 124/76 | HR 84

## 2019-07-05 DIAGNOSIS — R399 Unspecified symptoms and signs involving the genitourinary system: Secondary | ICD-10-CM

## 2019-07-05 DIAGNOSIS — N3001 Acute cystitis with hematuria: Secondary | ICD-10-CM

## 2019-07-05 LAB — POCT URINALYSIS DIP (CLINITEK)
Bilirubin, UA: NEGATIVE
Glucose, UA: NEGATIVE mg/dL
Ketones, POC UA: NEGATIVE mg/dL
Nitrite, UA: POSITIVE — AB
POC PROTEIN,UA: NEGATIVE
Spec Grav, UA: 1.01 (ref 1.010–1.025)
Urobilinogen, UA: 0.2 E.U./dL
pH, UA: 6 (ref 5.0–8.0)

## 2019-07-05 MED ORDER — NITROFURANTOIN MONOHYD MACRO 100 MG PO CAPS: 100.0000 mg | ORAL_CAPSULE | Freq: Two times a day (BID) | ORAL | 0 refills | Status: AC

## 2019-07-05 NOTE — Progress Notes (Signed)
Subjective:     Patient ID: Zoe Monroe, female   DOB: [birth year], 59 y.o.   MRN: [id number]  HPI Zoe Monroe presents to the [facility] today for 2 day hx of hematuria and urinary pressure. She reports drinking lots of water. She denies any dysuria or frequency, no fever, no N/V, abdominal pain, vaginal discharge or bleeding. She states she feels fatigued.  She reports similar episode about 4 years ago and was treated with bactrim, which caused nausea.   She denies being sexually active.   Past Medical History:  Diagnosis Date  . Hypertension   . Kidney stones   . Sciatica   . Urinary tract infection 2014   Allergies  Allergen Reactions  . Oxycodone     Causes her to have a headache.    Current Outpatient Medications:  .  eszopiclone (LUNESTA) 2 MG TABS, Take 2 mg by mouth at bedtime. Patient takes as needed. Take immediately before bedtime, Disp: , Rfl:  .  pregabalin (LYRICA) 50 MG capsule, Take 50 mg by mouth 2 (two) times daily. Patient states that she only takes once per day., Disp: , Rfl:  .  triamterene-hydrochlorothiazide (DYAZIDE) 37.5-25 MG per capsule, Take 1 capsule by mouth every morning., Disp: , Rfl:  .  ibuprofen (ADVIL,MOTRIN) 200 MG tablet, Take 400 mg by mouth every 6 (six) hours as needed. For pain, Disp: , Rfl:  .  nicotine (NICODERM CQ - DOSED IN MG/24 HOURS) 14 mg/24hr patch, Place 1 patch (14 mg total) onto the skin daily., Disp: 28 patch, Rfl: 1 .  nitrofurantoin, macrocrystal-monohydrate, (MACROBID) 100 MG capsule, Take 1 capsule (100 mg total) by mouth 2 (two) times daily for 5 days., Disp: 10 capsule, Rfl: 0      Review of Systems  Constitutional: Positive for fatigue. Negative for chills and fever.  Respiratory: Negative for shortness of breath.   Cardiovascular: Negative for chest pain.  Gastrointestinal: Negative for abdominal pain, blood in stool, constipation, diarrhea, nausea and vomiting.  Genitourinary: Positive for hematuria and  urgency. Negative for flank pain, pelvic pain, vaginal bleeding and vaginal discharge.       Objective:   Physical Exam Vitals reviewed.  Constitutional:      General: She is not in acute distress.    Appearance: Normal appearance. She is not toxic-appearing.  HENT:     Head: Normocephalic and atraumatic.  Cardiovascular:     Rate and Rhythm: Normal rate and regular rhythm.     Heart sounds: Normal heart sounds.  Pulmonary:     Effort: Pulmonary effort is normal. No respiratory distress.     Breath sounds: Normal breath sounds.  Abdominal:     Tenderness: There is no right CVA tenderness or left CVA tenderness.  Skin:    General: Skin is warm and dry.  Neurological:     Mental Status: She is alert and oriented to person, place, and time.  Psychiatric:        Mood and Affect: Mood normal.        Behavior: Behavior normal.    Today's Vitals   07/05/19 1347  BP: 124/76  Pulse: 84  SpO2: 95%   There is no height or weight on file to calculate BMI.  Results for orders placed or performed in visit on 07/05/19  POCT URINALYSIS DIP (CLINITEK)  Result Value Ref Range   Color, UA other (A) yellow   Clarity, UA cloudy (A) clear   Glucose, UA negative negative mg/dL  Bilirubin, UA negative negative   Ketones, POC UA negative negative mg/dL   Spec Grav, UA 2.761 4.709 - 1.025   Blood, UA large (A) negative   pH, UA 6.0 5.0 - 8.0   POC PROTEIN,UA negative negative, trace   Urobilinogen, UA 0.2 0.2 or 1.0 E.U./dL   Nitrite, UA Positive (A) Negative   Leukocytes, UA Large (3+) (A) Negative       Assessment:     UTI symptoms - Plan: POCT URINALYSIS DIP (CLINITEK)  Acute cystitis with hematuria      Plan:     1. Will treat UTI with macrobid x 5 days. May take otc AZO for burning/pain relief. Will send for culture and notify pt of results. Reviewed indications for ED visit - if she should develop fever, flank pain, abdominal pain, N/V she should go to the ED or urgent  care for further evaluation. If symptoms do not improve she will f/u or be seen by PCP/urgent care.

## 2019-07-05 NOTE — Patient Instructions (Addendum)
May use otc AZO for 2-3 days to help relieve the urinary pressure/pain.   Urinary Tract Infection, Adult A urinary tract infection (UTI) is an infection of any part of the urinary tract. The urinary tract includes:  The kidneys.  The ureters.  The bladder.  The urethra. These organs make, store, and get rid of pee (urine) in the body. What are the causes? This is caused by germs (bacteria) in your genital area. These germs grow and cause swelling (inflammation) of your urinary tract. What increases the risk? You are more likely to develop this condition if:  You have a small, thin tube (catheter) to drain pee.  You cannot control when you pee or poop (incontinence).  You are female, and: ? You use these methods to prevent pregnancy:  A medicine that kills sperm (spermicide).  A device that blocks sperm (diaphragm). ? You have low levels of a female hormone (estrogen). ? You are pregnant.  You have genes that add to your risk.  You are sexually active.  You take antibiotic medicines.  You have trouble peeing because of: ? A prostate that is bigger than normal, if you are female. ? A blockage in the part of your body that drains pee from the bladder (urethra). ? A kidney stone. ? A nerve condition that affects your bladder (neurogenic bladder). ? Not getting enough to drink. ? Not peeing often enough.  You have other conditions, such as: ? Diabetes. ? A weak disease-fighting system (immune system). ? Sickle cell disease. ? Gout. ? Injury of the spine. What are the signs or symptoms? Symptoms of this condition include:  Needing to pee right away (urgently).  Peeing often.  Peeing small amounts often.  Pain or burning when peeing.  Blood in the pee.  Pee that smells bad or not like normal.  Trouble peeing.  Pee that is cloudy.  Fluid coming from the vagina, if you are female.  Pain in the belly or lower back. Other symptoms include:  Throwing up  (vomiting).  No urge to eat.  Feeling mixed up (confused).  Being tired and grouchy (irritable).  A fever.  Watery poop (diarrhea). How is this treated? This condition may be treated with:  Antibiotic medicine.  Other medicines.  Drinking enough water. Follow these instructions at home:  Medicines  Take over-the-counter and prescription medicines only as told by your doctor.  If you were prescribed an antibiotic medicine, take it as told by your doctor. Do not stop taking it even if you start to feel better. General instructions  Make sure you: ? Pee until your bladder is empty. ? Do not hold pee for a long time. ? Empty your bladder after sex. ? Wipe from front to back after pooping if you are a female. Use each tissue one time when you wipe.  Drink enough fluid to keep your pee pale yellow.  Keep all follow-up visits as told by your doctor. This is important. Contact a doctor if:  You do not get better after 1-2 days.  Your symptoms go away and then come back. Get help right away if:  You have very bad back pain.  You have very bad pain in your lower belly.  You have a fever.  You are sick to your stomach (nauseous).  You are throwing up. Summary  A urinary tract infection (UTI) is an infection of any part of the urinary tract.  This condition is caused by germs in your genital area.  There are many risk factors for a UTI. These include having a small, thin tube to drain pee and not being able to control when you pee or poop.  Treatment includes antibiotic medicines for germs.  Drink enough fluid to keep your pee pale yellow. This information is not intended to replace advice given to you by your health care provider. Make sure you discuss any questions you have with your health care provider. Document Revised: 03/23/2018 Document Reviewed: 10/13/2017 Elsevier Patient Education  2020 [employer].

## 2019-07-07 LAB — URINE CULTURE

## 2019-10-05 ENCOUNTER — Other Ambulatory Visit: Payer: Self-pay | Admitting: Internal Medicine

## 2019-10-05 DIAGNOSIS — Z1231 Encounter for screening mammogram for malignant neoplasm of breast: Secondary | ICD-10-CM

## 2019-11-07 ENCOUNTER — Other Ambulatory Visit: Payer: Self-pay

## 2019-11-07 ENCOUNTER — Ambulatory Visit
Admission: RE | Admit: 2019-11-07 | Discharge: 2019-11-07 | Disposition: A | Discharge status: Home / Self Care | Payer: PRIVATE HEALTH INSURANCE | Source: Ambulatory Visit | Attending: Internal Medicine | Admitting: Internal Medicine

## 2019-11-07 DIAGNOSIS — Z1231 Encounter for screening mammogram for malignant neoplasm of breast: Secondary | ICD-10-CM

## 2020-10-01 ENCOUNTER — Other Ambulatory Visit: Payer: Self-pay | Admitting: Internal Medicine

## 2020-10-01 DIAGNOSIS — Z1231 Encounter for screening mammogram for malignant neoplasm of breast: Secondary | ICD-10-CM

## 2020-11-25 ENCOUNTER — Ambulatory Visit
Admission: RE | Admit: 2020-11-25 | Discharge: 2020-11-25 | Disposition: A | Discharge status: Home / Self Care | Payer: PRIVATE HEALTH INSURANCE | Source: Ambulatory Visit | Attending: Internal Medicine | Admitting: Internal Medicine

## 2020-11-25 ENCOUNTER — Other Ambulatory Visit: Payer: Self-pay

## 2020-11-25 DIAGNOSIS — Z1231 Encounter for screening mammogram for malignant neoplasm of breast: Secondary | ICD-10-CM

## 2020-12-23 ENCOUNTER — Ambulatory Visit: Payer: PRIVATE HEALTH INSURANCE

## 2021-01-13 ENCOUNTER — Ambulatory Visit: Payer: PRIVATE HEALTH INSURANCE | Admitting: Family Medicine

## 2021-01-13 VITALS — BP 128/82 | HR 68 | Ht 65.0 in | Wt 196.0 lb

## 2021-01-13 DIAGNOSIS — Z789 Other specified health status: Secondary | ICD-10-CM

## 2021-01-13 NOTE — Progress Notes (Signed)
Subjective:     Patient ID: Zoe Monroe, female   DOB: 18-May-1960, 60 y.o.   MRN: [id number]  HPI Zoe Monroe presents [facility] the [facility] today for her required wellness visit for her insurance. Her PCP is Dr. Nehemiah Settle. Her mammogram is UTD. She needs to call and find out when her last colonoscopy was. She has had a hysterectomy, no pap smear needed. She is still smoking 3 cigarettes/day but would like to quit. She needs to work on eating healthier and exercising.   Past Medical History:  Diagnosis Date   Hypertension    Kidney stones    Sciatica    Urinary tract infection 2014   Allergies  Allergen Reactions   Oxycodone     Causes her to have a headache.    Current Outpatient Medications:    eszopiclone (LUNESTA) 2 MG TABS, Take 2 mg by mouth at bedtime. Patient takes as needed. Take immediately before bedtime, Disp: , Rfl:    pregabalin (LYRICA) 50 MG capsule, Take 50 mg by mouth 2 (two) times daily. Patient states that she only takes once per day., Disp: , Rfl:    triamterene-hydrochlorothiazide (DYAZIDE) 37.5-25 MG per capsule, Take 1 capsule by mouth every morning., Disp: , Rfl:    ibuprofen (ADVIL,MOTRIN) 200 MG tablet, Take 400 mg by mouth every 6 (six) hours as needed. For pain, Disp: , Rfl:    nicotine (NICODERM CQ - DOSED IN MG/24 HOURS) 14 mg/24hr patch, Place 1 patch (14 mg total) onto the skin daily. (Patient not taking: Reported on 01/13/2021), Disp: 28 patch, Rfl: 1   Review of Systems  Constitutional:  Negative for chills, fatigue, fever and unexpected weight change.  HENT:  Negative for congestion, ear pain, sinus pressure, sinus pain and sore throat.   Eyes:  Negative for discharge and visual disturbance.  Respiratory:  Negative for cough, shortness of breath and wheezing.   Cardiovascular:  Negative for chest pain and leg swelling.  Gastrointestinal:  Negative for abdominal pain, blood in stool, constipation, diarrhea, nausea and vomiting.   Genitourinary:  Negative for difficulty urinating and hematuria.  Skin:  Negative for color change.  Neurological:  Negative for dizziness, weakness, light-headedness and headaches.  Hematological:  Negative for adenopathy.  All other systems reviewed and are negative.     Objective:   Physical Exam Vitals reviewed.  Constitutional:      General: She is not in acute distress.    Appearance: Normal appearance. She is well-developed.  HENT:     Head: Normocephalic and atraumatic.  Eyes:     General:        Right eye: No discharge.        Left eye: No discharge.  Cardiovascular:     Rate and Rhythm: Normal rate and regular rhythm.     Heart sounds: Normal heart sounds.  Pulmonary:     Effort: Pulmonary effort is normal. No respiratory distress.     Breath sounds: Normal breath sounds.  Musculoskeletal:     Cervical back: Neck supple.  Skin:    General: Skin is warm and dry.  Neurological:     Mental Status: She is alert and oriented to person, place, and time.  Psychiatric:        Mood and Affect: Mood normal.        Behavior: Behavior normal.   Today's Vitals   01/13/21 1145  BP: 128/82  Pulse: 68  SpO2: 98%  Weight: 196 lb (88.9 kg)  Height: 5\' 5"  (1.651 m)   Body mass index is 32.62 kg/m.     Assessment:     Participant in health and wellness plan     Plan:     Keep all regular appts with PCP. Smoking cessation discussed, info given for [employer] free [employer] classes. F/u here prn.

## 2021-09-27 ENCOUNTER — Encounter (HOSPITAL_COMMUNITY): Payer: Self-pay | Admitting: Emergency Medicine

## 2021-09-27 ENCOUNTER — Emergency Department (HOSPITAL_COMMUNITY)
Admission: EM | Admit: 2021-09-27 | Discharge: 2021-09-27 | Disposition: A | Discharge status: Home / Self Care | Payer: PRIVATE HEALTH INSURANCE | Attending: Emergency Medicine | Admitting: Emergency Medicine

## 2021-09-27 DIAGNOSIS — M545 Low back pain, unspecified: Secondary | ICD-10-CM | POA: Diagnosis present

## 2021-09-27 DIAGNOSIS — M5441 Lumbago with sciatica, right side: Secondary | ICD-10-CM | POA: Diagnosis not present

## 2021-09-27 MED ORDER — FENTANYL CITRATE PF 50 MCG/ML IJ SOSY: 25.0000 ug | PREFILLED_SYRINGE | Freq: Once | INTRAMUSCULAR | Status: DC

## 2021-09-27 MED ORDER — METHOCARBAMOL 500 MG PO TABS: 500.0000 mg | ORAL_TABLET | Freq: Two times a day (BID) | ORAL | 0 refills | Status: DC

## 2021-09-27 MED ORDER — HYDROCODONE-ACETAMINOPHEN 5-325 MG PO TABS
1.0000 | ORAL_TABLET | Freq: Once | ORAL | Status: AC
  Administered 2021-09-27: 1 via ORAL
  Filled 2021-09-27: qty 1

## 2021-09-27 MED ORDER — LIDOCAINE 5 % EX PTCH
1.0000 | MEDICATED_PATCH | CUTANEOUS | Status: DC
  Administered 2021-09-27: 1 via TRANSDERMAL
  Filled 2021-09-27: qty 1

## 2021-09-27 MED ORDER — NAPROXEN 375 MG PO TABS: 375.0000 mg | ORAL_TABLET | Freq: Two times a day (BID) | ORAL | 0 refills | Status: DC

## 2021-09-27 MED ORDER — KETOROLAC TROMETHAMINE 15 MG/ML IJ SOLN
15.0000 mg | Freq: Once | INTRAMUSCULAR | Status: AC
  Administered 2021-09-27: 15 mg via INTRAMUSCULAR
  Filled 2021-09-27: qty 1

## 2021-09-27 NOTE — Discharge Instructions (Signed)
Please follow-up with your orthopedic doctor for further evaluation.  Would like you to return to the emerged part for any worsening symptoms including weakness in the leg, trouble urinating, trouble having a bowel movement, or any other concerns you might have.

## 2021-09-27 NOTE — ED Notes (Signed)
Patient provided with ice pack

## 2021-09-27 NOTE — ED Provider Notes (Signed)
[facility]-EMERGENCY DEPT Provider Note   CSN: [id number] Arrival date & time: 09/27/21  1236     History Chief Complaint  Patient presents with   Back Pain    Zoe Monroe is a 61 y.o. female patient who presents to the emergency department today with right lower back pain that started yesterday.  Patient states Zoe Monroe is having pain that radiates down the entire right leg into the foot.  Also complaining of some numbness along the lateral aspect of the thigh.  Zoe Monroe denies any injury or trauma to the leg.  No bowel or bladder incontinence, fever, or chills.  Patient's been taking Lyrica and a muscle relaxer yesterday which did not offer any improvement.   Back Pain      Home Medications Prior to Admission medications   Medication Sig Start Date End Date Taking? Authorizing Provider  methocarbamol (ROBAXIN) 500 MG tablet Take 1 tablet (500 mg total) by mouth 2 (two) times daily. 09/27/21  Yes Meredeth Ide, Saraiyah Hemminger M, PA-C  naproxen (NAPROSYN) 375 MG tablet Take 1 tablet (375 mg total) by mouth 2 (two) times daily. 09/27/21  Yes Rumi Taras M, PA-C  eszopiclone (LUNESTA) 2 MG TABS Take 2 mg by mouth at bedtime. Patient takes as needed. Take immediately before bedtime    [provider]  ibuprofen (ADVIL,MOTRIN) 200 MG tablet Take 400 mg by mouth every 6 (six) hours as needed. For pain    [provider]  nicotine (NICODERM CQ - DOSED IN MG/24 HOURS) 14 mg/24hr patch Place 1 patch (14 mg total) onto the skin daily. Patient not taking: Reported on 01/13/2021 05/29/19   Jeannine Boga, NP  pregabalin (LYRICA) 50 MG capsule Take 50 mg by mouth 2 (two) times daily. Patient states that Zoe Monroe only takes once per day.    [provider]  triamterene-hydrochlorothiazide (DYAZIDE) 37.5-25 MG per capsule Take 1 capsule by mouth every morning.    [provider]      Allergies    Oxycodone    Review of Systems   Review of Systems   Musculoskeletal:  Positive for back pain.  All other systems reviewed and are negative.   Physical Exam Updated Vital Signs BP 140/74   Pulse 69   Temp 97.8 F (36.6 C) (Oral)   Resp 18   SpO2 100%  Physical Exam Vitals and nursing note reviewed.  Constitutional:      Appearance: Normal appearance.  HENT:     Head: Normocephalic and atraumatic.  Eyes:     General:        Right eye: No discharge.        Left eye: No discharge.     Conjunctiva/sclera: Conjunctivae normal.  Pulmonary:     Effort: Pulmonary effort is normal.  Musculoskeletal:     Comments: 5/5 strength to the lower extremities.  Normal sensation in the lower extremities.  There is no spinal tenderness over the thoracic or lumbar spine.  There is right paralumbar tenderness.  Skin:    General: Skin is warm and dry.     Findings: No rash.  Neurological:     General: No focal deficit present.     Mental Status: Zoe Monroe is alert.  Psychiatric:        Mood and Affect: Mood normal.        Behavior: Behavior normal.     ED Results / Procedures / Treatments   Labs (all labs ordered are listed, but only abnormal results are  displayed) Labs Reviewed - No data to display  EKG None  Radiology No results found.  Procedures Procedures    Medications Ordered in ED Medications  lidocaine (LIDODERM) 5 % 1 patch (1 patch Transdermal Patch Applied 09/27/21 1339)  ketorolac (TORADOL) 15 MG/ML injection 15 mg (15 mg Intramuscular Given 09/27/21 1338)  HYDROcodone-acetaminophen (NORCO/VICODIN) 5-325 MG per tablet 1 tablet (1 tablet Oral Given 09/27/21 1338)  HYDROcodone-acetaminophen (NORCO/VICODIN) 5-325 MG per tablet 1 tablet (1 tablet Oral Given 09/27/21 1433)    ED Course/ Medical Decision Making/ A&P Clinical Course as of 09/27/21 1515  Sun Sep 27, 2021  1504 On reevaluation, patient is feeling better after pain medications.  We discussed etiology of sciatica.  Patient does have an orthopedist that Zoe Monroe will call  and follow-up with.  I will write her for muscle relaxers, and additional pain medication in addition to a work note for tomorrow.  Patient amenable this plan.  Strict turn precaution were discussed.  Zoe Monroe is safe for discharge at this time. [CF]    Clinical Course User Index [CF] Teressa Lower, PA-C                           Medical Decision Making Zoe Monroe is a 61 y.o. female who presents to the emergency department for further evaluation of right-sided lower back pain.  Differential diagnosis includes lumbar strain, sciatica.  Have low suspicion for cauda equina or epidural abscess at this time.  We will give her pain medication.  Do not feel imaging is warranted at this time.   Risk Prescription drug management.    Final Clinical Impression(s) / ED Diagnoses Final diagnoses:  Acute right-sided low back pain with right-sided sciatica    Rx / DC Orders ED Discharge Orders          Ordered    methocarbamol (ROBAXIN) 500 MG tablet  2 times daily        09/27/21 1506    naproxen (NAPROSYN) 375 MG tablet  2 times daily        09/27/21 1506              Honor Loh [place], [place] 09/27/21 1515    Edwin Dada P, DO 09/29/21 [phone]

## 2021-09-27 NOTE — ED Notes (Signed)
ED Provider at bedside. 

## 2021-09-27 NOTE — ED Triage Notes (Signed)
Patient c/o R lower back pain x2 days. Reports taking muscle relaxer, using ice and heat with little relief. Hx sciatica. Denies incontinence and injury.

## 2021-10-01 ENCOUNTER — Other Ambulatory Visit: Payer: Self-pay | Admitting: Physical Medicine and Rehabilitation

## 2021-10-01 DIAGNOSIS — M5416 Radiculopathy, lumbar region: Secondary | ICD-10-CM

## 2021-10-26 ENCOUNTER — Other Ambulatory Visit: Payer: Self-pay | Admitting: Internal Medicine

## 2021-10-26 DIAGNOSIS — Z1231 Encounter for screening mammogram for malignant neoplasm of breast: Secondary | ICD-10-CM

## 2021-11-27 ENCOUNTER — Ambulatory Visit
Admission: RE | Admit: 2021-11-27 | Discharge: 2021-11-27 | Disposition: A | Discharge status: Home / Self Care | Payer: PRIVATE HEALTH INSURANCE | Source: Ambulatory Visit | Attending: Internal Medicine | Admitting: Internal Medicine

## 2021-11-27 DIAGNOSIS — Z1231 Encounter for screening mammogram for malignant neoplasm of breast: Secondary | ICD-10-CM

## 2021-12-15 ENCOUNTER — Ambulatory Visit: Payer: PRIVATE HEALTH INSURANCE | Admitting: Nurse Practitioner

## 2021-12-15 VITALS — BP 140/85 | HR 79

## 2021-12-15 DIAGNOSIS — Z789 Other specified health status: Secondary | ICD-10-CM

## 2021-12-15 NOTE — Progress Notes (Signed)
Office Visit  Subjective:  Patient ID: Zoe Monroe, female    DOB: [birth year]  Age: 61 y.o. MRN: [id number]  CC: Wellness Exam  HPI Crystel Demarco presents for wellness exam visit for insurance benefit.  Patient has a PCP: Dr. Renford Dills PMH significant for: HTN currently on dyazide and hx of chronic low back pain on lyrica.  Last labs per PCP were completed: September 2022  Health Maintenance:  Colonoscopy: Reports up to date.  Mammogram: August 2023 Pap: hx of hysterectomy.     Smoker: 1 pack every 3 days. Started smoking in her 91s. Not ready to quit. Has nicotine patches at home but has not used them.   Immunizations:  Shingrix-  declined Pneumovax - declined. COVID- x4  Lifestyle: Diet- reports "awful" Exercise- stays very active, walks a lot at work.    Past Medical History:  Diagnosis Date   Hypertension    Kidney stones    Sciatica    Urinary tract infection 2014    Past Surgical History:  Procedure Laterality Date   ABDOMINAL HYSTERECTOMY      Outpatient Medications Prior to Visit  Medication Sig Dispense Refill   eszopiclone (LUNESTA) 2 MG TABS Take 2 mg by mouth at bedtime. Patient takes as needed. Take immediately before bedtime     pregabalin (LYRICA) 50 MG capsule Take 50 mg by mouth 2 (two) times daily. Patient states that she only takes once per day.     triamterene-hydrochlorothiazide (DYAZIDE) 37.5-25 MG per capsule Take 1 capsule by mouth every morning.     nicotine (NICODERM CQ - DOSED IN MG/24 HOURS) 14 mg/24hr patch Place 1 patch (14 mg total) onto the skin daily. (Patient not taking: Reported on 01/13/2021) 28 patch 1   ibuprofen (ADVIL,MOTRIN) 200 MG tablet Take 400 mg by mouth every 6 (six) hours as needed. For pain     methocarbamol (ROBAXIN) 500 MG tablet Take 1 tablet (500 mg total) by mouth 2 (two) times daily. (Patient not taking: Reported on 12/15/2021) 20 tablet 0   naproxen (NAPROSYN) 375 MG tablet Take 1 tablet (375 mg total) by  mouth 2 (two) times daily. (Patient not taking: Reported on 12/15/2021) 20 tablet 0   No facility-administered medications prior to visit.    ROS Review of Systems  Respiratory:  Negative for shortness of breath.   Cardiovascular:  Negative for chest pain.  Musculoskeletal:  Positive for back pain.    Objective:  BP (!) 140/85   Pulse 79   SpO2 97%   Physical Exam Constitutional:      General: She is not in acute distress. HENT:     Head: Normocephalic.  Cardiovascular:     Rate and Rhythm: Normal rate and regular rhythm.     Heart sounds: Normal heart sounds.  Pulmonary:     Effort: Pulmonary effort is normal.     Breath sounds: Normal breath sounds.  Musculoskeletal:        General: Normal range of motion.  Skin:    General: Skin is warm.  Neurological:     General: No focal deficit present.     Mental Status: She is alert and oriented to person, place, and time.  Psychiatric:        Mood and Affect: Mood normal.        Behavior: Behavior normal.      Assessment & Plan:    Shenia was seen today for wellness exam.  Diagnoses and all orders for this visit:  Participant in health and wellness plan Adult wellness physical was conducted today. Importance of diet and exercise were discussed in detail. Patient plans to aim for 10k steps daily.  We reviewed immunizations and gave recommendations regarding current immunization needed for age. Declines updating vaccines at this time.  Preventative health exams needed: up to date. Encouraged smoking cessation Patient was advised yearly wellness exam    No orders of the defined types were placed in this encounter.   No orders of the defined types were placed in this encounter.   Follow-up: Return if symptoms worsen or fail to improve.

## 2022-04-27 ENCOUNTER — Ambulatory Visit: Payer: PRIVATE HEALTH INSURANCE | Admitting: Nurse Practitioner

## 2022-04-27 NOTE — Progress Notes (Deleted)
   Subjective:  Patient ID: Zoe Monroe, female    DOB: Jan 28, 1961  Age: 62 y.o. MRN: [id number]  CC: No chief complaint on file.   HPI Tya Haughey presents for wellness exam visit for insurance benefit.  Patient has a PCP: PMH significant for:  Last labs per PCP were completed   Health Maintenance:  Colonoscopy: Mammogram: DEXA: PSA    Smoker:  Immunizations:  Shingrix-   Pneumovax - COVID-   Lifestyle: Diet- Exercise-     Past Medical History:  Diagnosis Date   Hypertension    Kidney stones    Sciatica    Urinary tract infection 2014    Past Surgical History:  Procedure Laterality Date   ABDOMINAL HYSTERECTOMY      Outpatient Medications Prior to Visit  Medication Sig Dispense Refill   eszopiclone (LUNESTA) 2 MG TABS Take 2 mg by mouth at bedtime. Patient takes as needed. Take immediately before bedtime     pregabalin (LYRICA) 50 MG capsule Take 50 mg by mouth 2 (two) times daily. Patient states that she only takes once per day.     triamterene-hydrochlorothiazide (DYAZIDE) 37.5-25 MG per capsule Take 1 capsule by mouth every morning.     No facility-administered medications prior to visit.    ROS Review of Systems  Objective:  There were no vitals taken for this visit.  Physical Exam   Assessment & Plan:    Diagnoses and all orders for this visit:  Participant in health and wellness plan   Adult wellness physical was conducted today. Importance of diet and exercise were discussed in detail.  We reviewed immunizations and gave recommendations regarding current immunization needed for age.  Preventative health exams needed: Colonoscopy   Patient was advised yearly wellness exam  No orders of the defined types were placed in this encounter.   No orders of the defined types were placed in this encounter.   Follow-up: No follow-ups on file.

## 2022-05-04 ENCOUNTER — Ambulatory Visit: Payer: PRIVATE HEALTH INSURANCE | Admitting: Nurse Practitioner

## 2022-05-29 IMAGING — MG MM DIGITAL SCREENING BILAT W/ TOMO AND CAD
8 series · 8 of 24 positions shown · non-contrast
Comparison: Previous exam(s).

CLINICAL DATA: Screening.

EXAM:
DIGITAL SCREENING BILATERAL MAMMOGRAM WITH TOMOSYNTHESIS AND CAD
TECHNIQUE: Bilateral screening digital craniocaudal and mediolateral oblique
mammograms were obtained. Bilateral screening digital breast
tomosynthesis was performed. The images were evaluated with
computer-aided detection.

[R CC synth-2D]
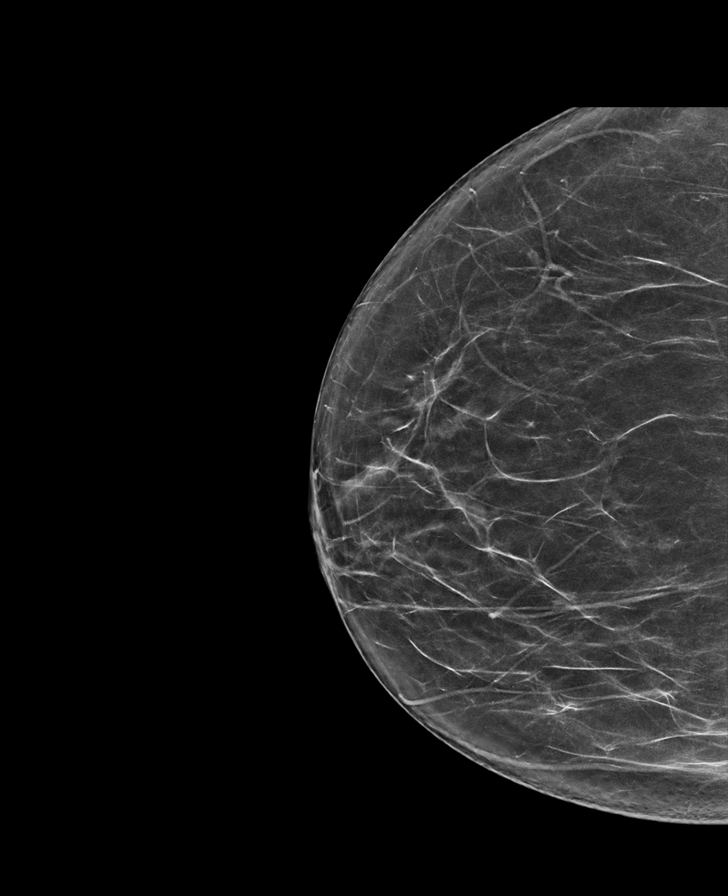

[L MLO synth-2D]
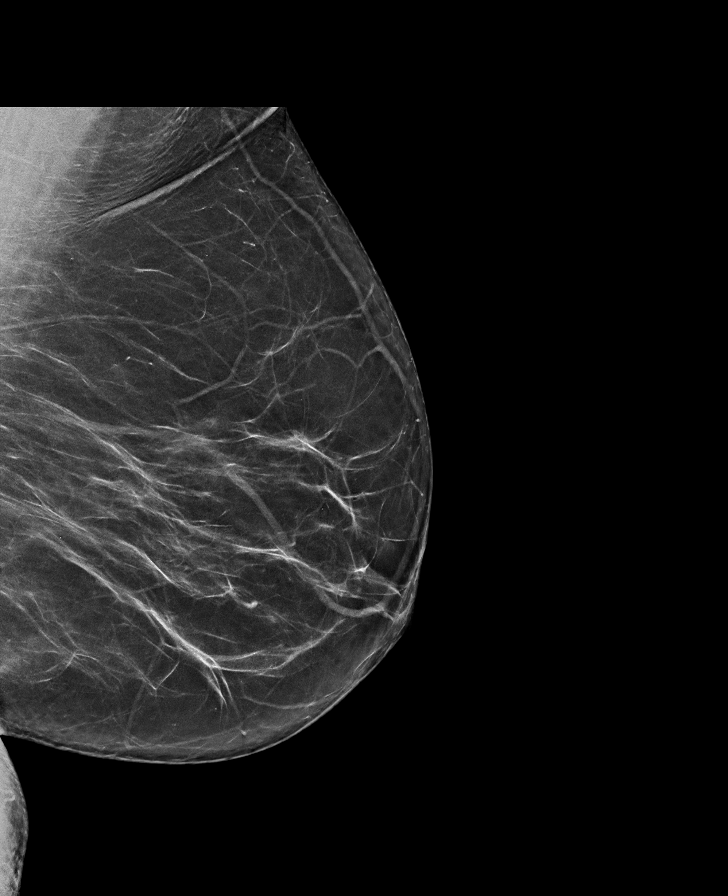

[R MLO synth-2D]
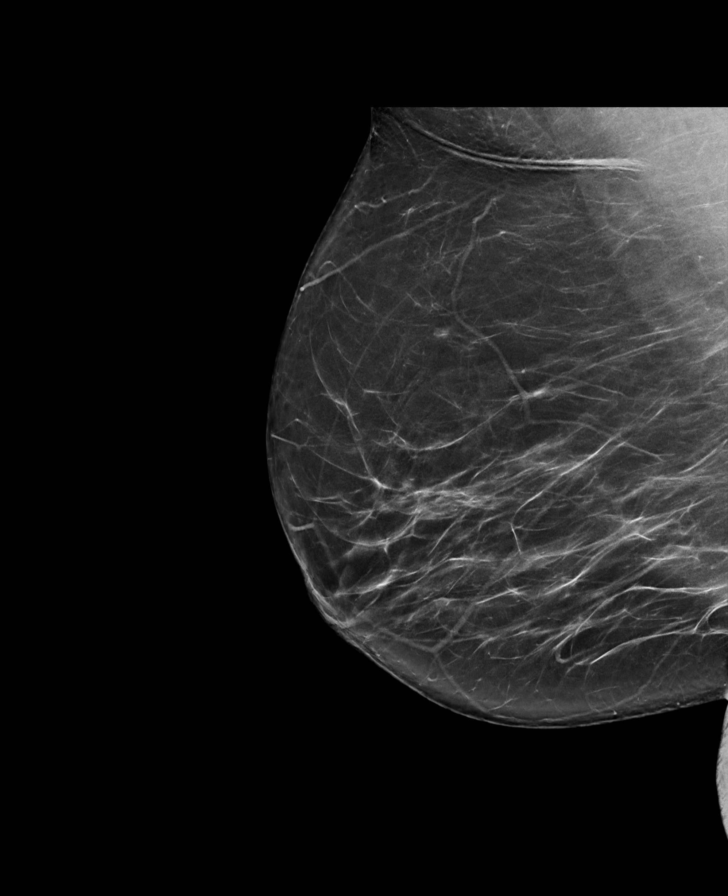

[L CC synth-2D]
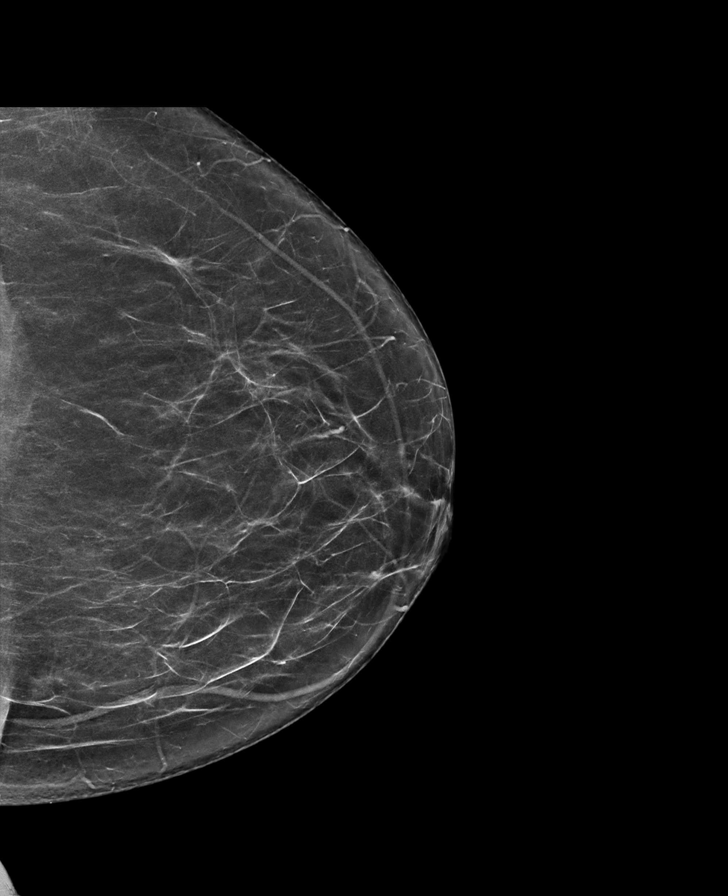

[R CC tomo · tomo slice 35/70.0]
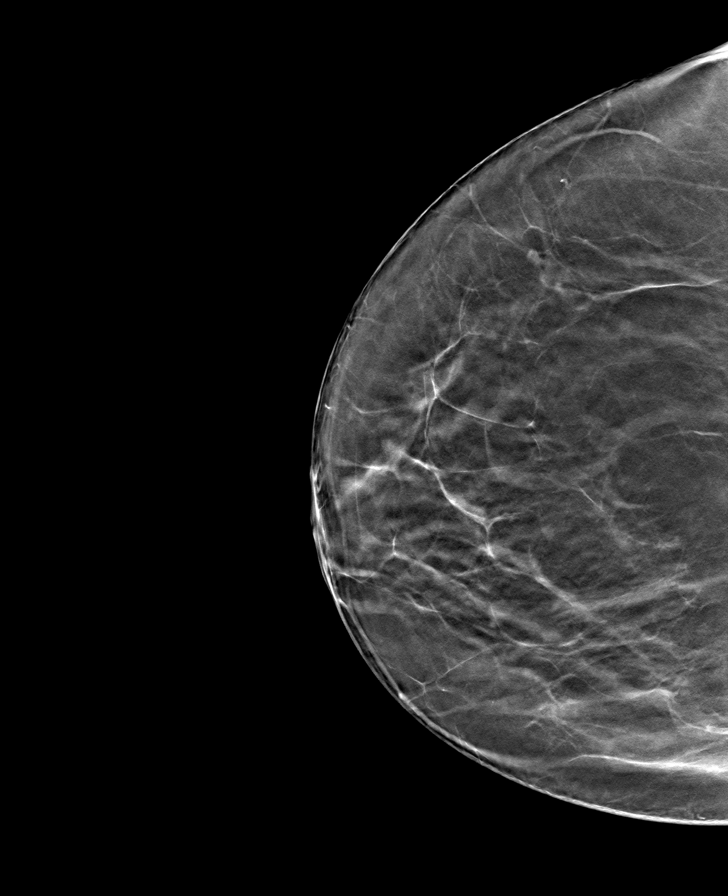

[L CC tomo · tomo slice 36/71.0]
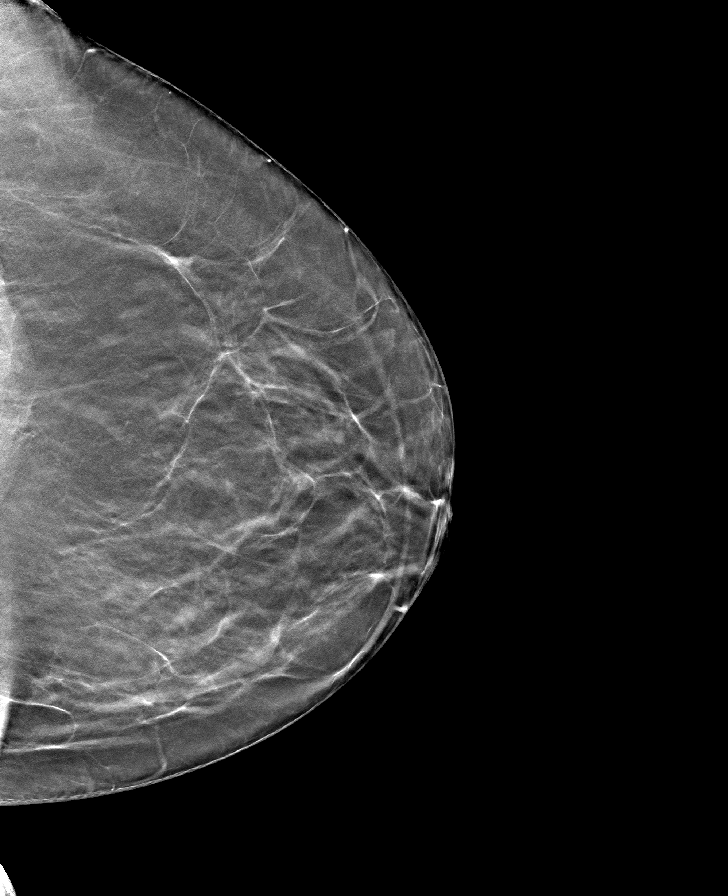

[L MLO tomo · tomo slice 38/75.0]
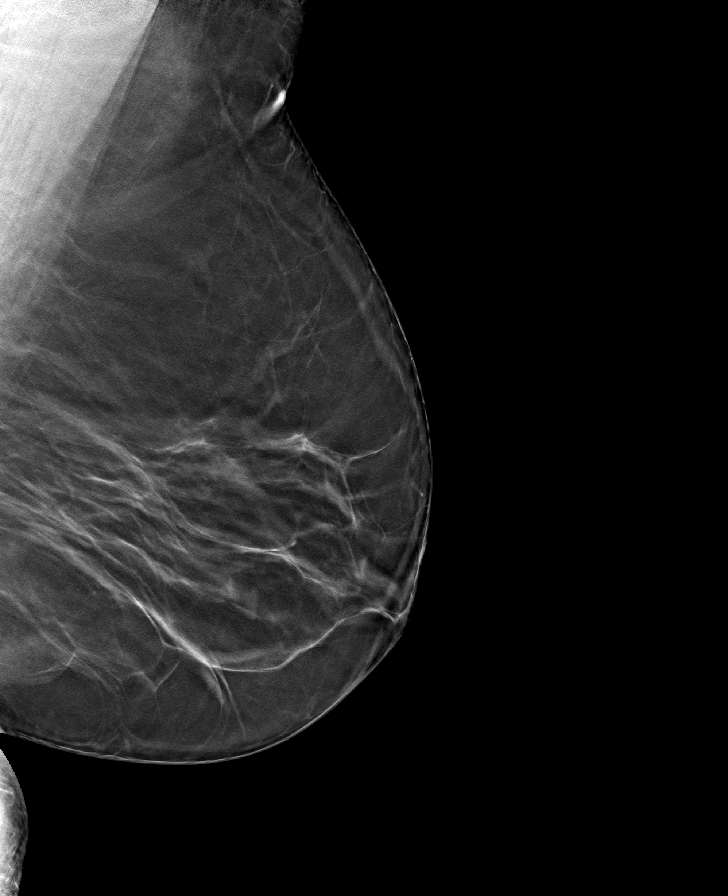

[R MLO tomo · tomo slice 41/82.0]
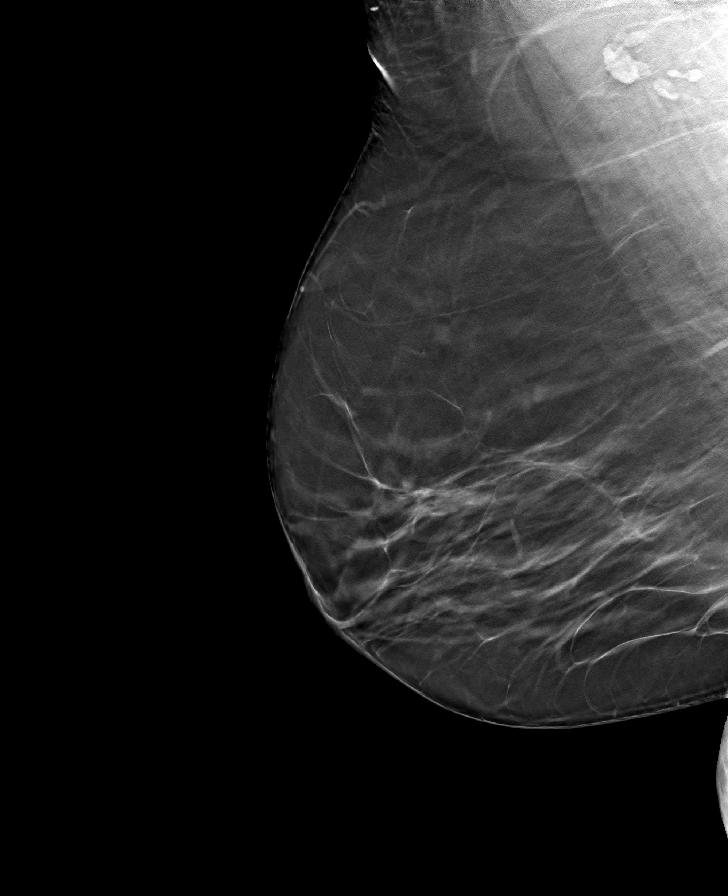

[8 of 24 positions shown; findings below may reference images not displayed]

ACR Breast Density Category b: There are scattered areas of
fibroglandular density.
FINDINGS: There are no findings suspicious for malignancy.
IMPRESSION: No mammographic evidence of malignancy. A result letter of this
screening mammogram will be mailed directly to the patient.

RECOMMENDATION:
Screening mammogram in one year. (Code:51-O-LD2)

BI-RADS CATEGORY  1: Negative.

## 2022-11-10 ENCOUNTER — Other Ambulatory Visit: Payer: Self-pay | Admitting: Internal Medicine

## 2022-11-10 DIAGNOSIS — Z1231 Encounter for screening mammogram for malignant neoplasm of breast: Secondary | ICD-10-CM

## 2022-11-30 ENCOUNTER — Ambulatory Visit
Admission: RE | Admit: 2022-11-30 | Discharge: 2022-11-30 | Disposition: A | Discharge status: Home / Self Care | Payer: PRIVATE HEALTH INSURANCE | Source: Ambulatory Visit | Attending: Internal Medicine | Admitting: Internal Medicine

## 2022-11-30 DIAGNOSIS — Z1231 Encounter for screening mammogram for malignant neoplasm of breast: Secondary | ICD-10-CM

## 2022-12-07 ENCOUNTER — Encounter: Payer: Self-pay | Admitting: Nurse Practitioner

## 2022-12-07 ENCOUNTER — Ambulatory Visit: Payer: PRIVATE HEALTH INSURANCE | Admitting: Nurse Practitioner

## 2022-12-07 VITALS — BP 132/92 | HR 74

## 2022-12-07 DIAGNOSIS — Z789 Other specified health status: Secondary | ICD-10-CM

## 2022-12-07 NOTE — Progress Notes (Signed)
[facility]- [facility]  Subjective:  Patient ID: Zoe Monroe, female    DOB: 05-24-1960  Age: 62 y.o. MRN: [id number]  CC: wellness Exam  HPI Zoe Monroe presents for wellness exam visit for insurance benefit.  Patient has a PCP: Dr. Nehemiah Settle PMH significant for: HTN, insomnia  Last labs per PCP were completed: Oct 2023  Health Maintenance:  Colonoscopy: Feb 2024, repeat in 10 years Mammogram: August 2024  Pap: hx of hysterectomy.   Smoker: current smoker.   Immunizations:  Shingrix-  needs  COVID- x 4 Flu- yearly   Lifestyle: Diet- avoids red meat, drinks a lot of water, low sodium.  Exercise- stays active at work but no dedicated time for exercise.    Past Medical History:  Diagnosis Date   Hypertension    Kidney stones    Sciatica    Urinary tract infection 2014    Past Surgical History:  Procedure Laterality Date   ABDOMINAL HYSTERECTOMY      Outpatient Medications Prior to Visit  Medication Sig Dispense Refill   escitalopram (LEXAPRO) 10 MG tablet Take 10 mg by mouth daily.     eszopiclone (LUNESTA) 2 MG TABS Take 2 mg by mouth at bedtime. Patient takes as needed. Take immediately before bedtime     pregabalin (LYRICA) 50 MG capsule Take 50 mg by mouth 2 (two) times daily. Patient states that she only takes once per day.     triamterene-hydrochlorothiazide (DYAZIDE) 37.5-25 MG per capsule Take 1 capsule by mouth every morning.     No facility-administered medications prior to visit.    ROS Review of Systems  Constitutional:  Negative for fatigue.  HENT:  Negative for hearing loss.   Eyes:  Negative for visual disturbance.  Respiratory:  Negative for shortness of breath.   Cardiovascular:  Negative for chest pain and leg swelling.  Gastrointestinal:  Negative for constipation, diarrhea and nausea.  Musculoskeletal:  Positive for back pain (chronic).  Neurological:  Negative for dizziness. Headaches: chronic.   Objective:  BP (!) 132/92    Pulse 74   SpO2 97%   Physical Exam Constitutional:      General: She is not in acute distress. HENT:     Head: Normocephalic.  Eyes:     Pupils: Pupils are equal, round, and reactive to light.  Cardiovascular:     Rate and Rhythm: Normal rate and regular rhythm.     Heart sounds: Normal heart sounds.  Pulmonary:     Effort: Pulmonary effort is normal.  Abdominal:     General: Abdomen is flat. Bowel sounds are normal.     Palpations: Abdomen is soft.  Musculoskeletal:        General: Normal range of motion.     Right lower leg: No edema.     Left lower leg: No edema.  Skin:    General: Skin is warm.  Neurological:     General: No focal deficit present.     Mental Status: She is alert and oriented to person, place, and time.  Psychiatric:        Mood and Affect: Mood normal.        Behavior: Behavior normal.      Assessment & Plan:    Dicy was seen today for wellness exam.  Diagnoses and all orders for this visit:  Participant in health and wellness plan  Adult wellness physical was conducted today. Importance of diet and exercise were discussed in detail. Encouraged smoking cessation.  We  reviewed immunizations and gave recommendations regarding current immunization needed for age. Recommended Shingrix. Patient is interested and scheduled appt for immunization.  Preventative health exams up to date.  Patient was advised yearly wellness exam  No orders of the defined types were placed in this encounter.   No orders of the defined types were placed in this encounter.   Follow-up: As needed.

## 2023-04-26 ENCOUNTER — Encounter: Payer: Self-pay | Admitting: Nurse Practitioner

## 2023-04-26 ENCOUNTER — Ambulatory Visit: Payer: PRIVATE HEALTH INSURANCE | Admitting: Nurse Practitioner

## 2023-04-26 VITALS — BP 138/72 | HR 68 | Temp 97.9°F

## 2023-04-26 DIAGNOSIS — R1900 Intra-abdominal and pelvic swelling, mass and lump, unspecified site: Secondary | ICD-10-CM

## 2023-04-26 NOTE — Progress Notes (Signed)
 Acute Office Visit- [facility]  Subjective:     Patient ID: Zoe Monroe, female    DOB: Jun 16, 1960, 63 y.o.   MRN: [id number]  Chief Complaint  Patient presents with   knot on back    HPI Patient is in today for c/o of knot on left flank area. First noticed this 2 weeks. She was experiencing some muscular pain to the area . She states she applied ice to area. Next day she noticed a bump in the area; she is unsure how long this has been present.   Reports this area has not change in size over the past 2 weeks. Denies pain, redness or heat to site. Denies fevers, chills, or malaise Reports she has lost 15 pounds recently and believes that she may not have noticed it before her weight loss.   Review of Systems  Constitutional:  Negative for chills, fever and malaise/fatigue.  Respiratory:  Negative for cough and shortness of breath.   Cardiovascular:  Negative for chest pain.  Skin:  Negative for rash.       Objective:    BP 138/72   Pulse 68   Temp 97.9 F (36.6 C)   SpO2 98%    Physical Exam Constitutional:      General: She is not in acute distress. Cardiovascular:     Rate and Rhythm: Regular rhythm.  Pulmonary:     Effort: Pulmonary effort is normal. No respiratory distress.  Musculoskeletal:       Back:     Comments: Small, soft, doughy, moveable lump noted in flank area with deep palpation. No erythema or tenderness noted on palpation. Area slightly increased in size compared to right side.   Neurological:     Mental Status: She is alert.     No results found for any visits on 04/26/23.      Assessment & Plan:   Problem List Items Addressed This Visit   None Visit Diagnoses       Left flank mass    -  Primary     Suspect lipoma. Patient instructed to monitor area for changes in size. Recommended follow-up with PCP for imaging and further work-up if indicated.   No orders of the defined types were placed in this encounter.   Follow-up as  needed.   Juliahna Wiswell Flores Devony Mcgrady, NP

## 2023-11-29 ENCOUNTER — Other Ambulatory Visit: Payer: Self-pay | Admitting: Internal Medicine

## 2023-11-29 DIAGNOSIS — Z1231 Encounter for screening mammogram for malignant neoplasm of breast: Secondary | ICD-10-CM

## 2023-12-23 ENCOUNTER — Ambulatory Visit
Admission: RE | Admit: 2023-12-23 | Discharge: 2023-12-23 | Disposition: A | Discharge status: Home / Self Care | Payer: PRIVATE HEALTH INSURANCE | Source: Ambulatory Visit | Attending: Internal Medicine | Admitting: Internal Medicine

## 2023-12-23 DIAGNOSIS — Z1231 Encounter for screening mammogram for malignant neoplasm of breast: Secondary | ICD-10-CM
